# Patient Record
Sex: Female | Born: 1960
Health system: Southern US, Community
[De-identification: ages and names within clinical notes are randomized; demographics above are authoritative.]

## PROBLEM LIST (undated history)

## (undated) DIAGNOSIS — E669 Obesity, unspecified: Secondary | ICD-10-CM

## (undated) DIAGNOSIS — Z973 Presence of spectacles and contact lenses: Secondary | ICD-10-CM

## (undated) DIAGNOSIS — E079 Disorder of thyroid, unspecified: Secondary | ICD-10-CM

## (undated) DIAGNOSIS — J189 Pneumonia, unspecified organism: Secondary | ICD-10-CM

## (undated) DIAGNOSIS — I10 Essential (primary) hypertension: Secondary | ICD-10-CM

## (undated) DIAGNOSIS — H81399 Other peripheral vertigo, unspecified ear: Secondary | ICD-10-CM

## (undated) DIAGNOSIS — R002 Palpitations: Secondary | ICD-10-CM

## (undated) DIAGNOSIS — Z972 Presence of dental prosthetic device (complete) (partial): Secondary | ICD-10-CM

## (undated) DIAGNOSIS — K219 Gastro-esophageal reflux disease without esophagitis: Secondary | ICD-10-CM

## (undated) DIAGNOSIS — E785 Hyperlipidemia, unspecified: Secondary | ICD-10-CM

## (undated) DIAGNOSIS — J45909 Unspecified asthma, uncomplicated: Secondary | ICD-10-CM

## (undated) DIAGNOSIS — F32A Depression, unspecified: Secondary | ICD-10-CM

## (undated) DIAGNOSIS — J449 Chronic obstructive pulmonary disease, unspecified: Secondary | ICD-10-CM

## (undated) DIAGNOSIS — R7301 Impaired fasting glucose: Secondary | ICD-10-CM

## (undated) HISTORY — DX: Essential (primary) hypertension: I10

## (undated) HISTORY — DX: Unspecified asthma, uncomplicated: J45.909

## (undated) HISTORY — DX: Obesity, unspecified: E66.9

## (undated) HISTORY — DX: Other peripheral vertigo, unspecified ear: H81.399

## (undated) HISTORY — DX: Disorder of thyroid, unspecified: E07.9

## (undated) HISTORY — DX: Impaired fasting glucose: R73.01

## (undated) HISTORY — DX: Hyperlipidemia, unspecified: E78.5

## (undated) HISTORY — PX: ABDOMINAL HYSTERECTOMY: SHX81

## (undated) HISTORY — PX: APPENDECTOMY: SHX54

## (undated) HISTORY — DX: Gastro-esophageal reflux disease without esophagitis: K21.9

## (undated) HISTORY — DX: Chronic obstructive pulmonary disease, unspecified: J44.9

---

## 1990-12-22 DIAGNOSIS — C539 Malignant neoplasm of cervix uteri, unspecified: Secondary | ICD-10-CM

## 1990-12-22 HISTORY — PX: ABDOMINAL HYSTERECTOMY: SHX81

## 1990-12-22 HISTORY — DX: Malignant neoplasm of cervix uteri, unspecified: C53.9

## 1990-12-22 HISTORY — PX: APPENDECTOMY: SHX54

## 2007-10-09 ENCOUNTER — Emergency Department (HOSPITAL_COMMUNITY): Admission: EM | Admit: 2007-10-09 | Discharge: 2007-10-10 | Payer: Self-pay | Admitting: Emergency Medicine

## 2008-01-17 ENCOUNTER — Ambulatory Visit: Payer: Self-pay | Admitting: Gastroenterology

## 2008-01-17 LAB — HM COLONOSCOPY

## 2008-01-18 ENCOUNTER — Ambulatory Visit: Payer: Self-pay | Admitting: Gastroenterology

## 2009-03-26 IMAGING — CR DG CHEST 2V
2 series · 2 of 2 positions shown · non-contrast
Comparison: None.

CLINICAL DATA: Shortness of breath, cough and wheezing.
 CHEST ? 2 VIEW:

[view not recorded (1 of 2)]
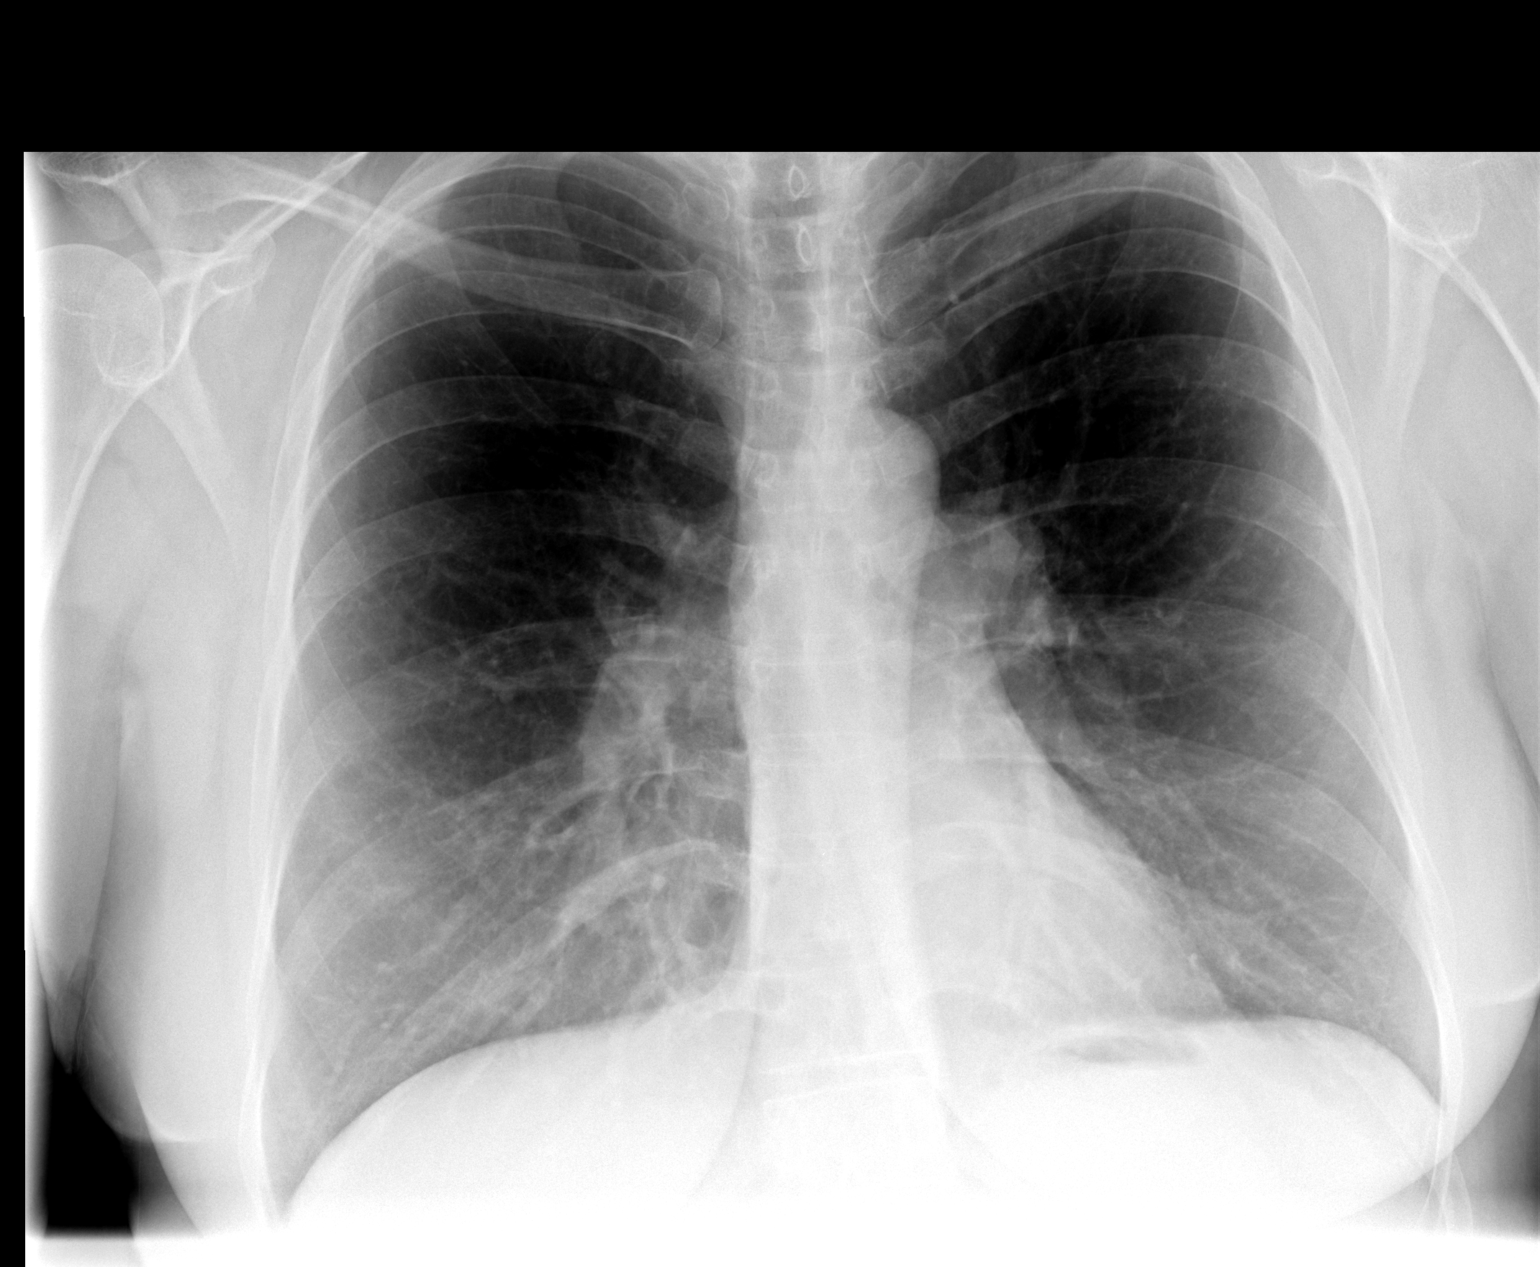

[view not recorded (2 of 2)]
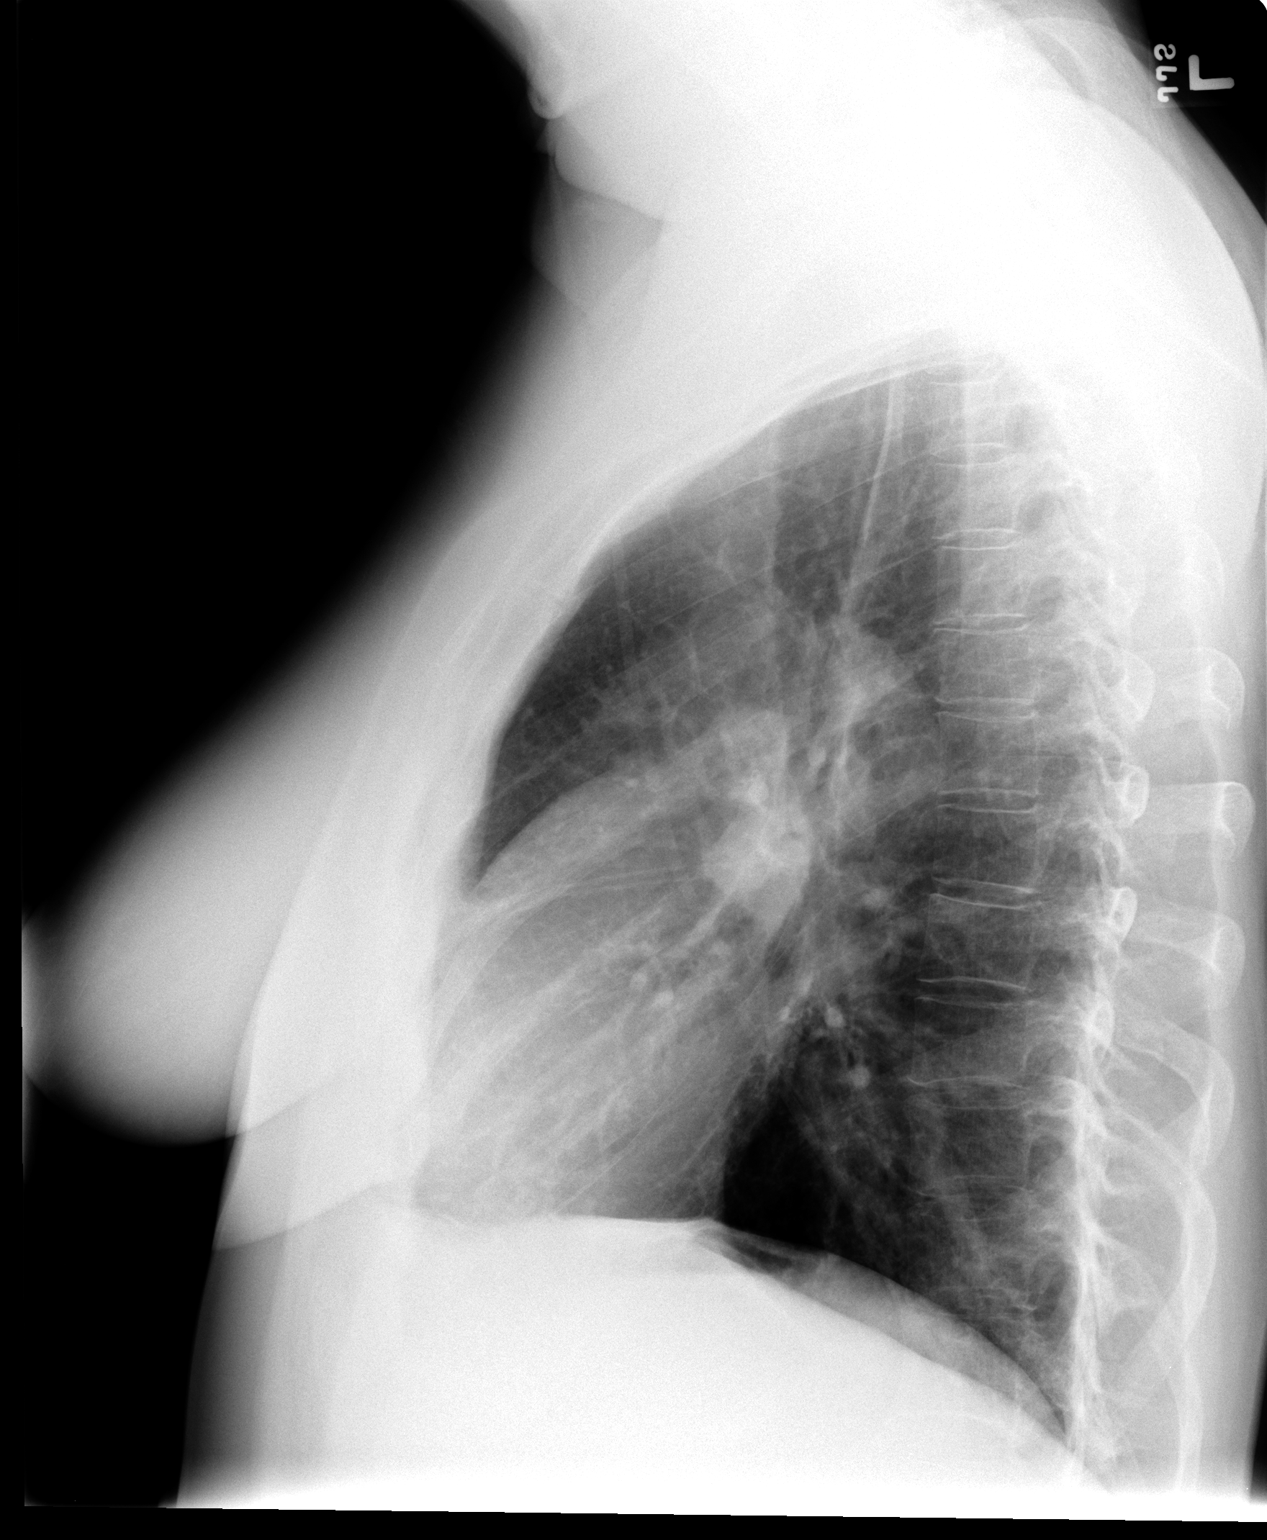

[2 of 2 positions shown; findings below may reference images not displayed]

FINDINGS: There is bilateral hyperexpansion.  No focal consolidation in the left lung.  There is right infrahilar atelectasis or infiltrate.  Cardio pericardial silhouette is within normal limits for size.  Bony structures or the imaged thorax are intact.
IMPRESSION: Atelectasis or infiltrate in the medial right base.  Followup is recommended to ensure resolution.

## 2009-07-05 IMAGING — CR DG BARIUM ENEMA
1 series · 8 of 10 positions shown · non-contrast
Comparison: none

REASON FOR EXAM: family HX of diverticulosis  and colon CA  s/p
colonoscopy
COMMENTS:

PROCEDURE:     FL  - FL BARIUM ENEMA (COLON)  - January 18, 2008  [DATE]
RESULT:     Sigmoid colonic diverticulosis is noted. The colon is otherwise
unremarkable. The RIGHT lower quadrant and terminal ileum are normal.

[Series 1: view not recorded · 0.17mm/px · 8 of 12 slices shown]
[im 1/12]
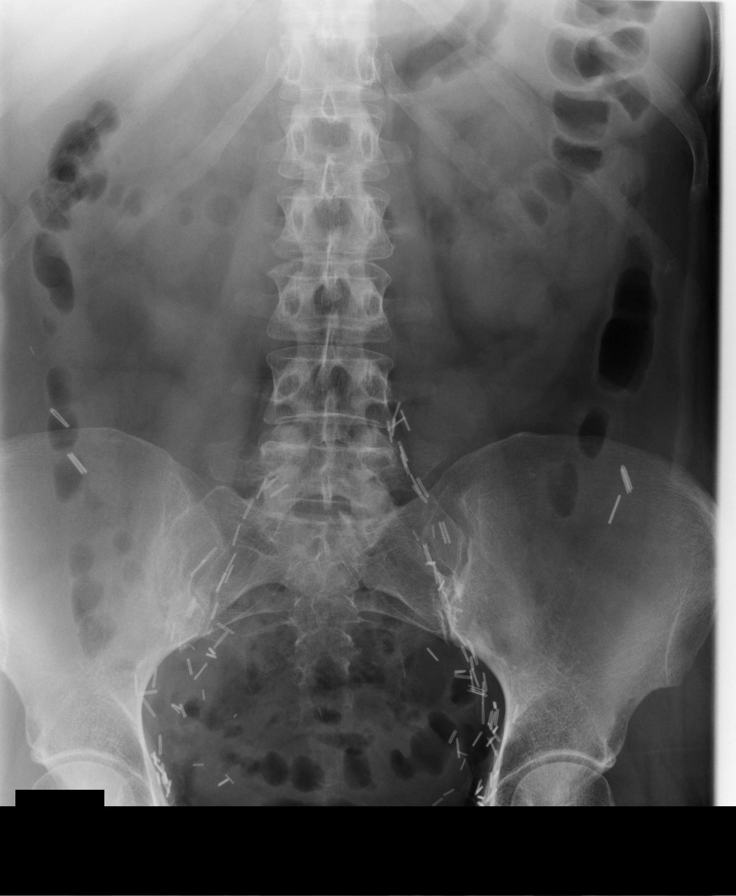
[im 2/12]
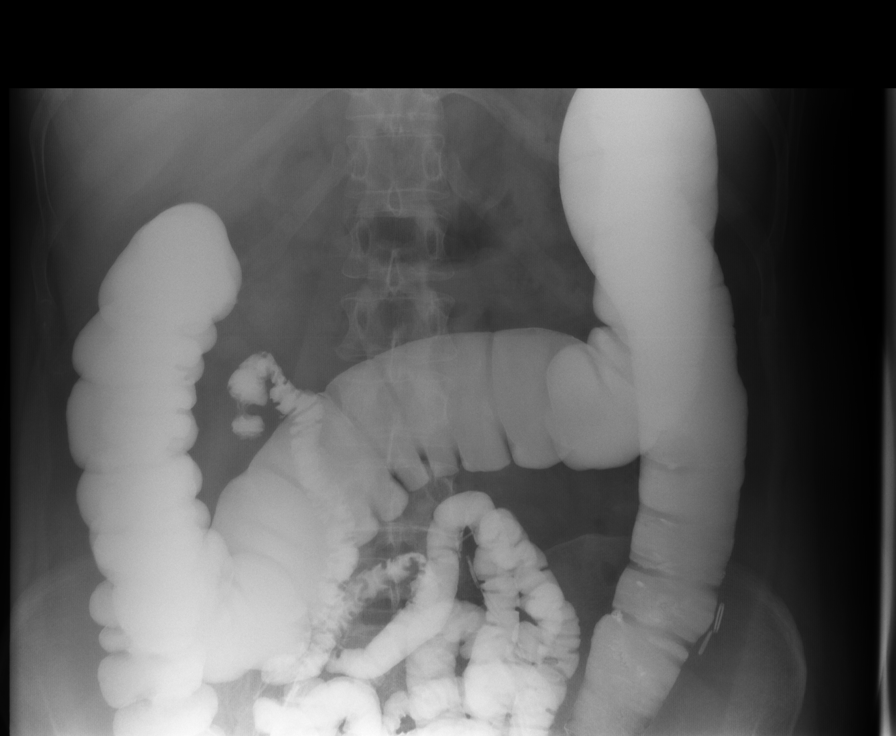
[im 3/12]
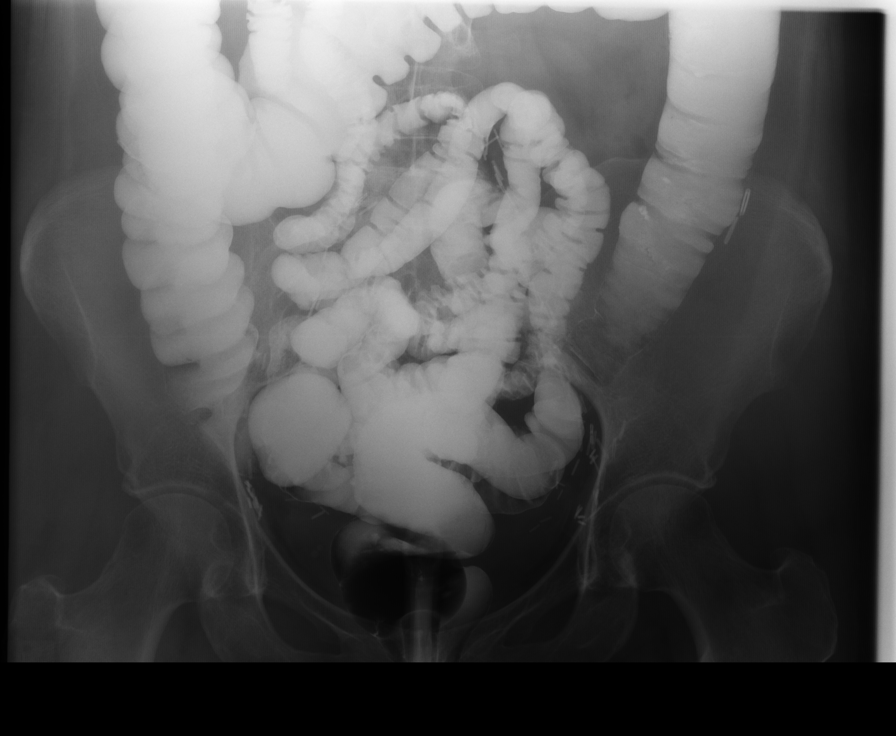
[im 4/12]
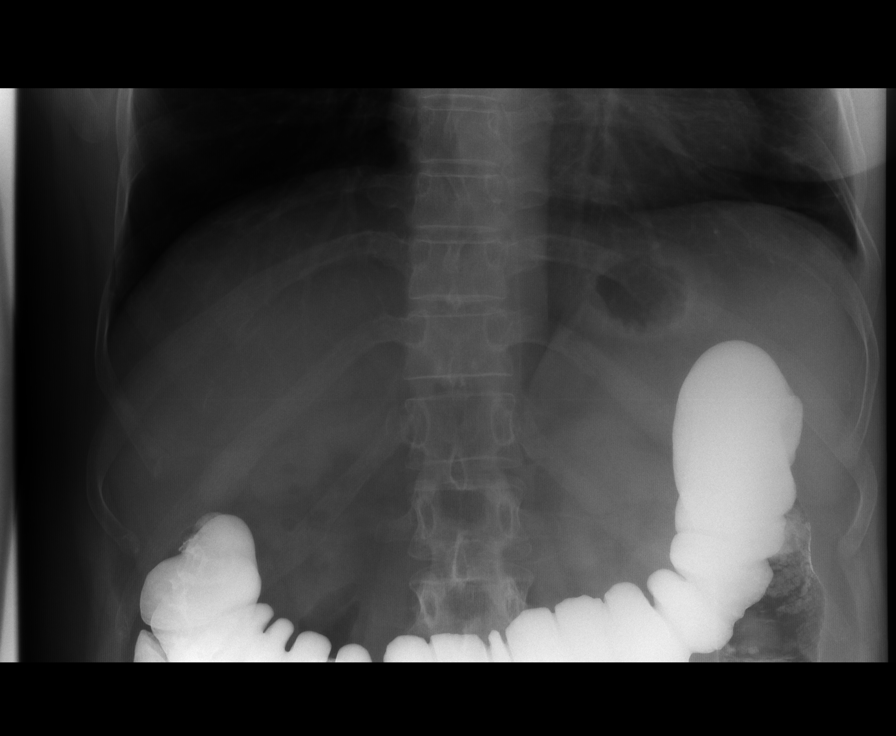
[im 5/12]
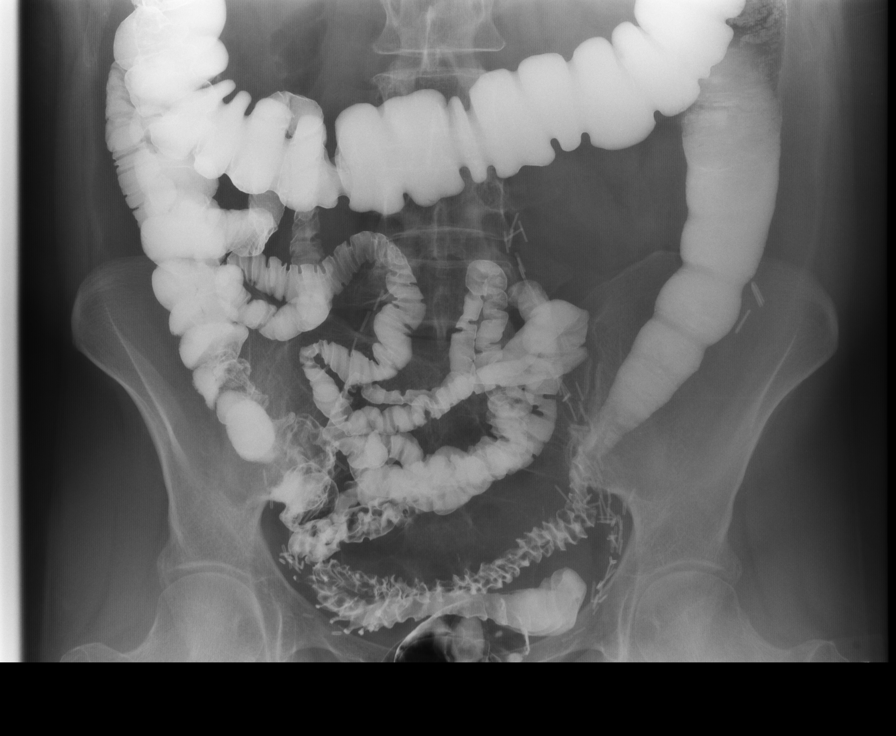
[im 7/12]
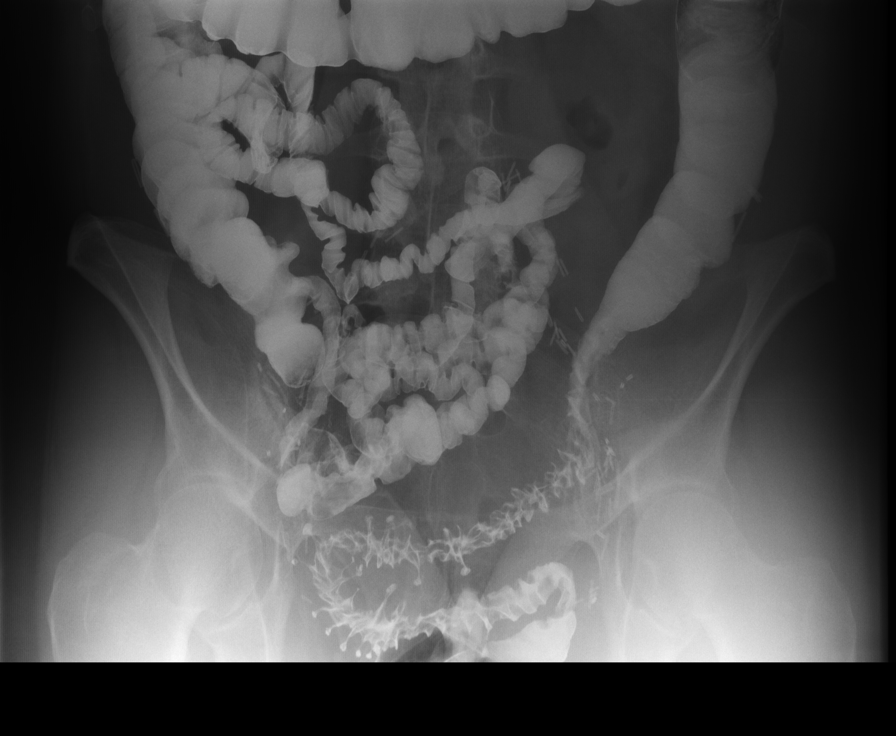
[im 8/12]
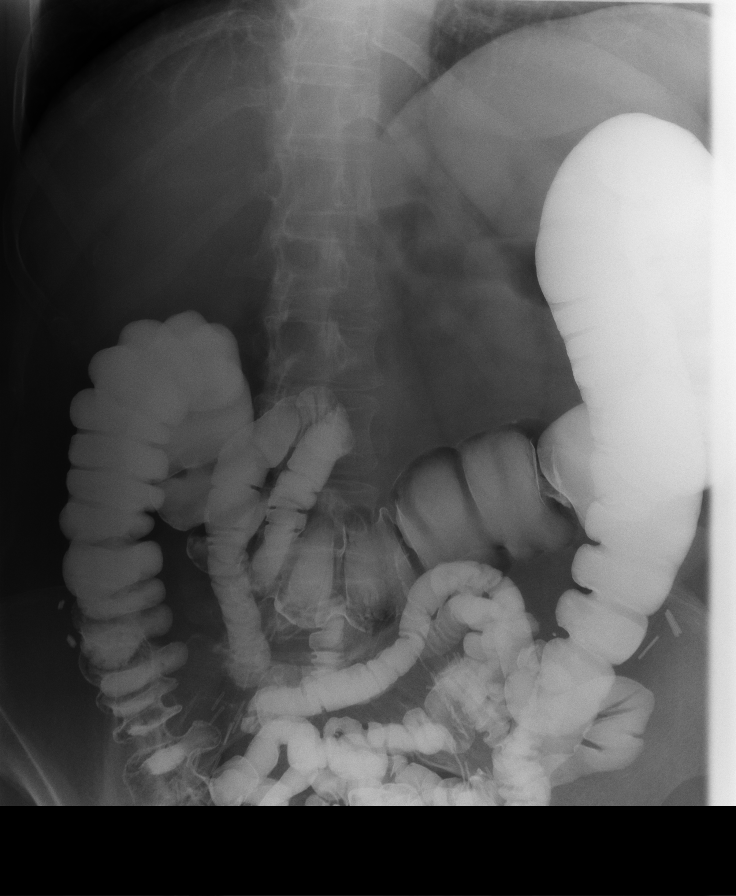
[im 9/12]
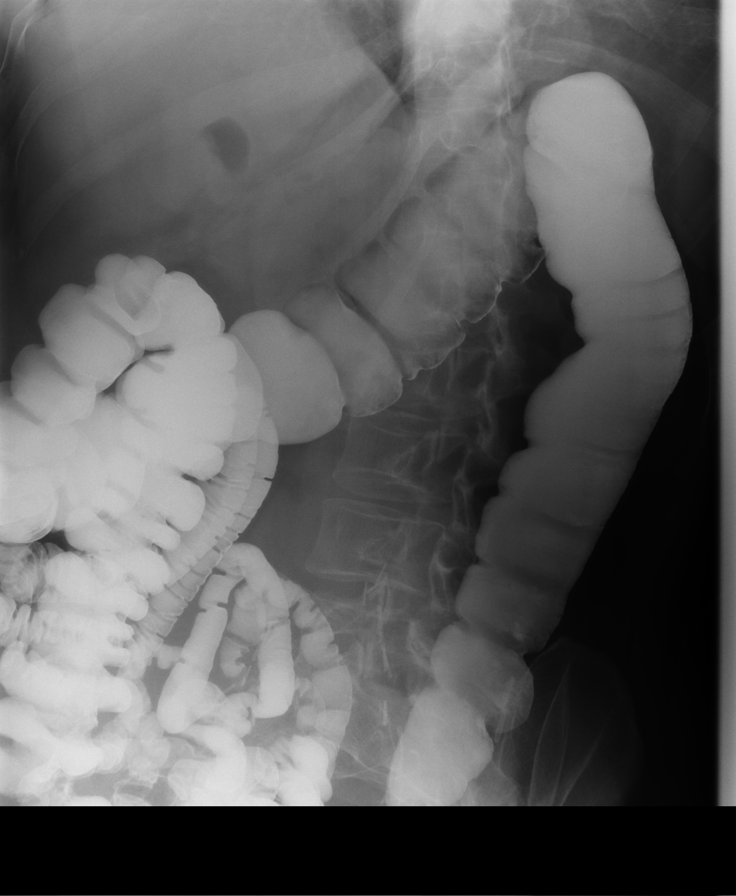

[8 of 10 positions shown; findings below may reference images not displayed]

IMPRESSION: 1)Sigmoid colonic diverticulosis, otherwise, normal exam.

## 2009-07-05 IMAGING — RF DG BARIUM ENEMA
1 series · 10 of 10 positions shown · non-contrast
Comparison: none

REASON FOR EXAM: family HX of diverticulosis  and colon CA  s/p
colonoscopy
COMMENTS:

PROCEDURE:     FL  - FL BARIUM ENEMA (COLON)  - January 18, 2008  [DATE]
RESULT:     Sigmoid colonic diverticulosis is noted. The colon is otherwise
unremarkable. The RIGHT lower quadrant and terminal ileum are normal.

[Series 1: run · 10 of 10 slices shown]
[im 1/10]
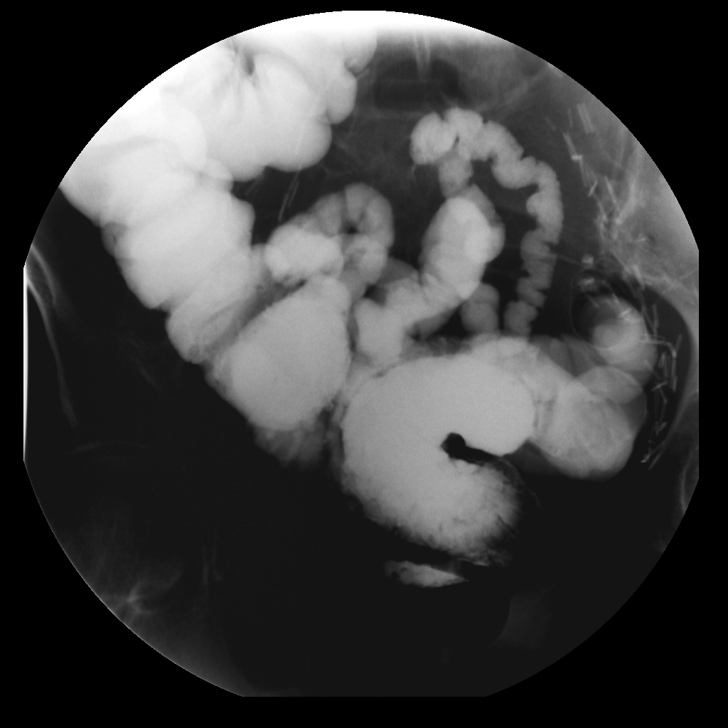
[im 2/10]
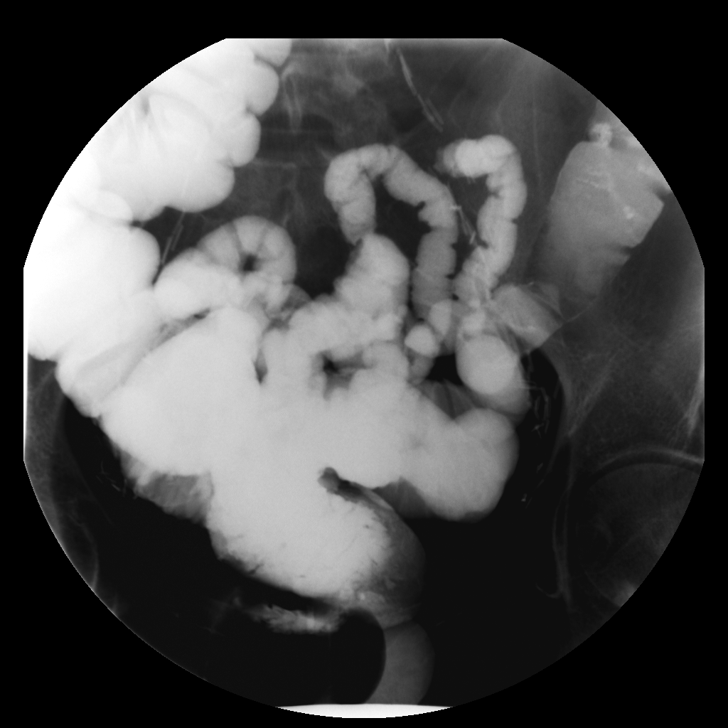
[im 3/10]
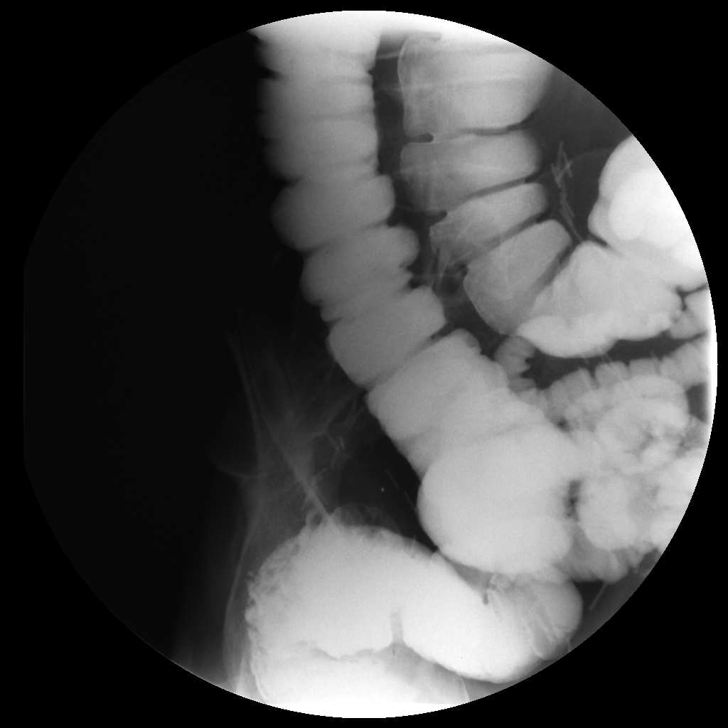
[im 4/10]
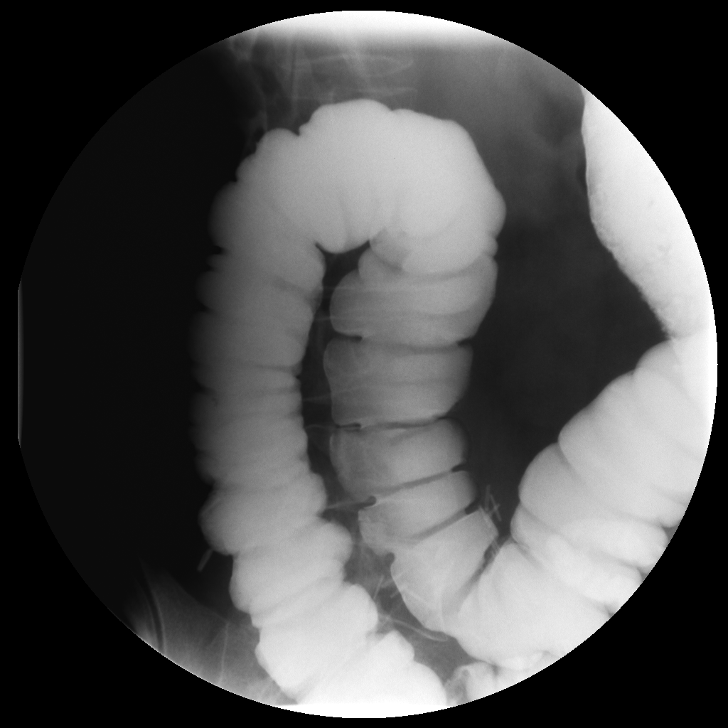
[im 5/10]
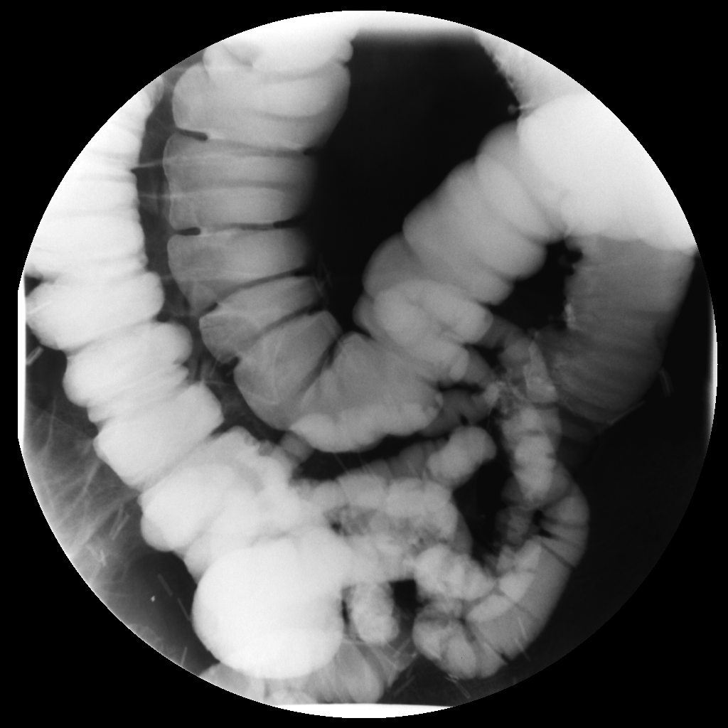
[im 6/10]
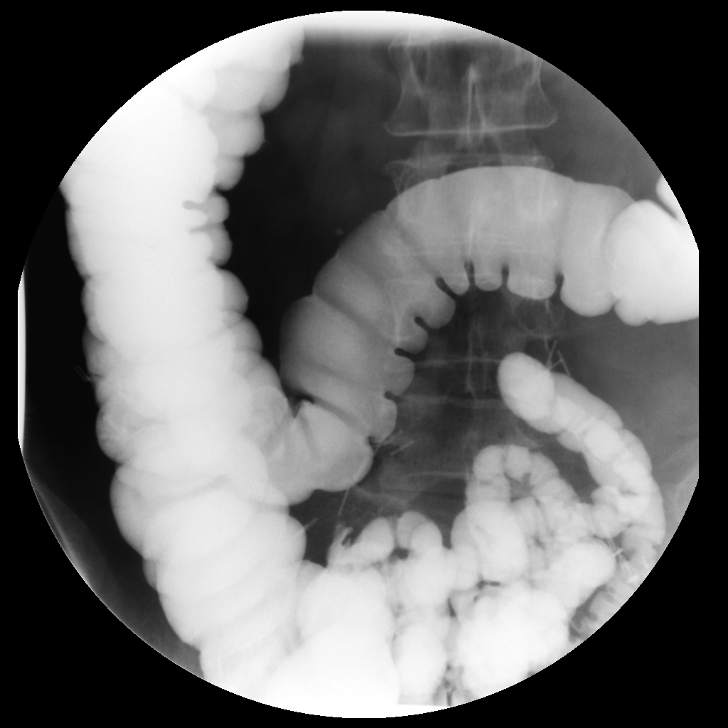
[im 7/10]
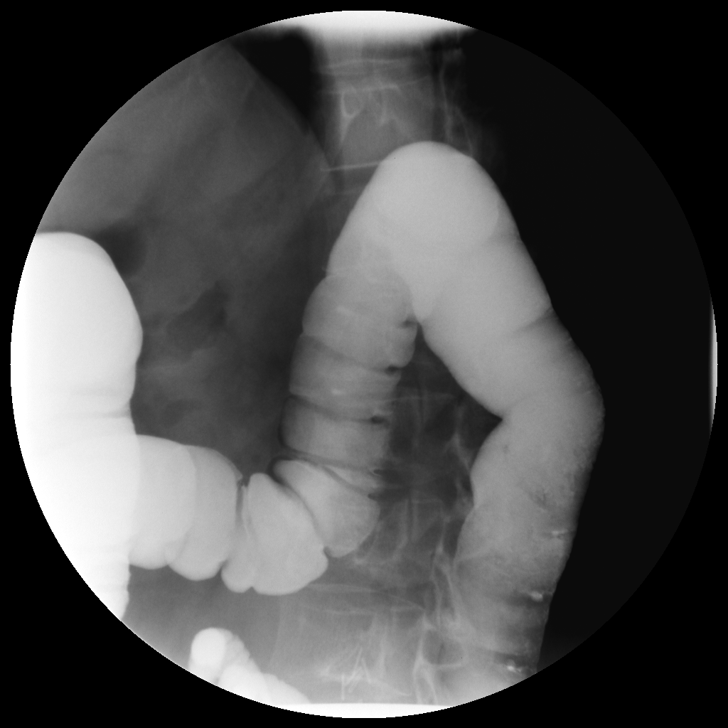
[im 8/10]
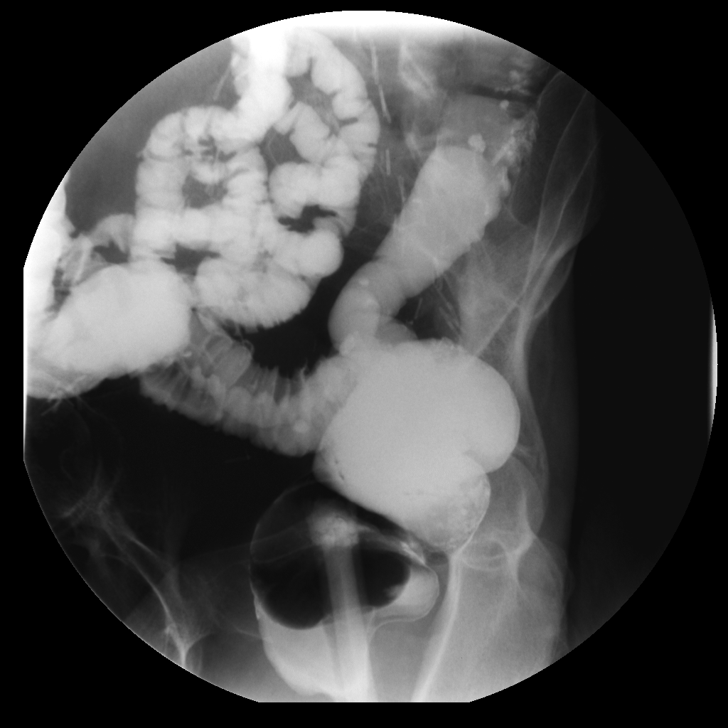
[im 9/10]
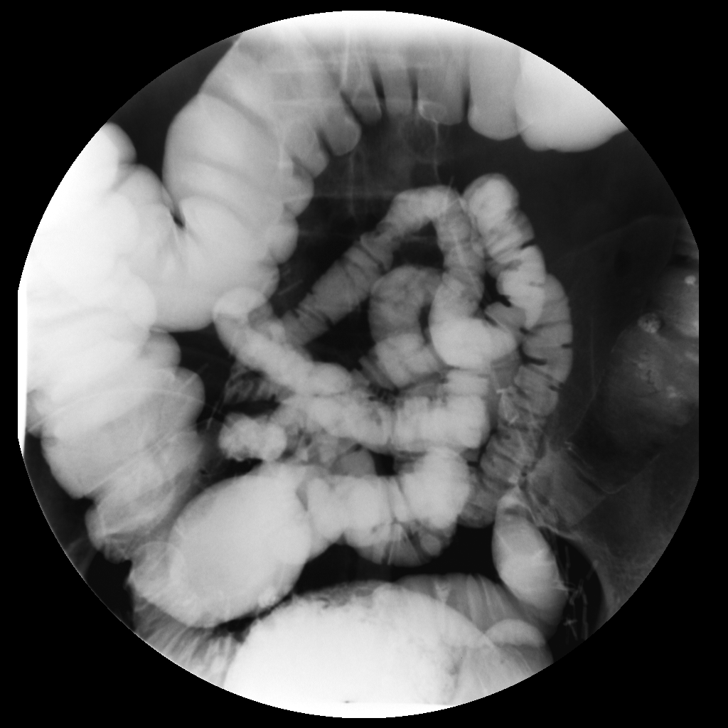
[im 10/10]
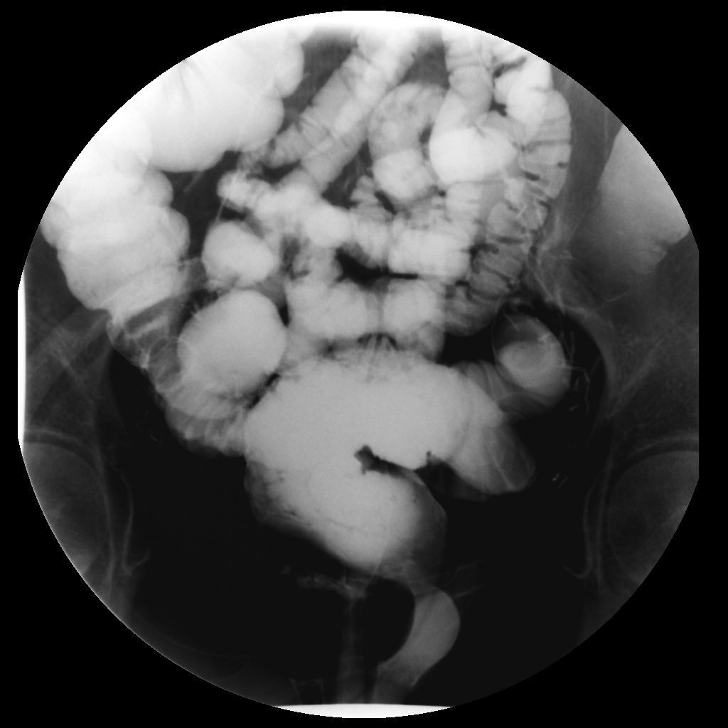

[10 of 10 positions shown; findings below may reference images not displayed]

IMPRESSION: 1)Sigmoid colonic diverticulosis, otherwise, normal exam.

## 2013-01-31 LAB — HM MAMMOGRAPHY

## 2013-02-23 LAB — HM PAP SMEAR

## 2015-06-04 ENCOUNTER — Other Ambulatory Visit: Payer: Self-pay | Admitting: Family Medicine

## 2015-06-07 ENCOUNTER — Telehealth: Payer: Self-pay | Admitting: Unknown Physician Specialty

## 2015-06-07 NOTE — Telephone Encounter (Signed)
I need to know the amount.

## 2015-06-07 NOTE — Telephone Encounter (Signed)
E-Fax came through for refill: Rx: Synthroid tab Rx will be in basket next to my desk.

## 2015-06-08 MED ORDER — LEVOTHYROXINE SODIUM 50 MCG PO TABS: 50.0000 ug | ORAL_TABLET | Freq: Every day | ORAL | Status: DC

## 2015-06-08 NOTE — Addendum Note (Signed)
Addended by: Kathrine Haddock on: 06/08/2015 02:54 PM   Modules accepted: Orders

## 2016-02-06 ENCOUNTER — Encounter: Payer: Self-pay | Admitting: Unknown Physician Specialty

## 2016-02-06 ENCOUNTER — Ambulatory Visit (INDEPENDENT_AMBULATORY_CARE_PROVIDER_SITE_OTHER): Payer: BLUE CROSS/BLUE SHIELD | Admitting: Unknown Physician Specialty

## 2016-02-06 VITALS — BP 155/92 | HR 80 | Temp 98.4°F | Ht 67.0 in | Wt 210.0 lb

## 2016-02-06 DIAGNOSIS — J42 Unspecified chronic bronchitis: Secondary | ICD-10-CM

## 2016-02-06 DIAGNOSIS — E669 Obesity, unspecified: Secondary | ICD-10-CM

## 2016-02-06 DIAGNOSIS — I10 Essential (primary) hypertension: Secondary | ICD-10-CM

## 2016-02-06 DIAGNOSIS — R7301 Impaired fasting glucose: Secondary | ICD-10-CM

## 2016-02-06 DIAGNOSIS — K219 Gastro-esophageal reflux disease without esophagitis: Secondary | ICD-10-CM

## 2016-02-06 DIAGNOSIS — E785 Hyperlipidemia, unspecified: Secondary | ICD-10-CM

## 2016-02-06 DIAGNOSIS — J209 Acute bronchitis, unspecified: Secondary | ICD-10-CM

## 2016-02-06 DIAGNOSIS — J449 Chronic obstructive pulmonary disease, unspecified: Secondary | ICD-10-CM

## 2016-02-06 DIAGNOSIS — H81399 Other peripheral vertigo, unspecified ear: Secondary | ICD-10-CM | POA: Insufficient documentation

## 2016-02-06 DIAGNOSIS — E039 Hypothyroidism, unspecified: Secondary | ICD-10-CM

## 2016-02-06 DIAGNOSIS — J45909 Unspecified asthma, uncomplicated: Secondary | ICD-10-CM

## 2016-02-06 MED ORDER — AZITHROMYCIN 250 MG PO TABS: ORAL_TABLET | ORAL | Status: DC

## 2016-02-06 MED ORDER — PREDNISONE 20 MG PO TABS: 40.0000 mg | ORAL_TABLET | Freq: Every day | ORAL | Status: DC

## 2016-02-06 NOTE — Progress Notes (Signed)
BP 155/92 mmHg  Pulse 80  Temp(Src) 98.4 F (36.9 C)  Ht 5\' 7"  (1.702 m)  Wt 210 lb (95.255 kg)  BMI 32.88 kg/m2  SpO2 95%  LMP 02/05/1991 (Approximate)   Subjective:    Patient ID: [name], female    DOB: [birth year], 55 y.o.   MRN: [id number]  HPI: [name] is a 55 y.o. female  Chief Complaint  Patient presents with  . URI    pt states she had nasal congestion, sinus pressure, and headache that started Saturday and then moved to her chest.    URI  Chronicity: Pt with history of asthma and COOD has a cold  for the last 3 days.  Takes her Symbicort daily and taking her rescue ihaler  The problem has been gradually worsening. Associated symptoms include chest pain, congestion, coughing and wheezing. Associated symptoms comments: C/o SOB . She has tried inhaler use (rare rescue inhaler use) for the symptoms.     Relevant past medical, surgical, family and social history reviewed and updated as indicated. Interim medical history since our last visit reviewed. Allergies and medications reviewed and updated.  Review of Systems  HENT: Positive for congestion.   Respiratory: Positive for cough and wheezing.   Cardiovascular: Positive for chest pain.    Per HPI unless specifically indicated above     Objective:    BP 155/92 mmHg  Pulse 80  Temp(Src) 98.4 F (36.9 C)  Ht 5\' 7"  (1.702 m)  Wt 210 lb (95.255 kg)  BMI 32.88 kg/m2  SpO2 95%  LMP 02/05/1991 (Approximate)  Wt Readings from Last 3 Encounters:  02/06/16 210 lb (95.255 kg)  09/18/14 226 lb (102.513 kg)    Physical Exam  Constitutional: She is oriented to person, place, and time. She appears well-developed and well-nourished. No distress.  HENT:  Head: Normocephalic and atraumatic.  Right Ear: Tympanic membrane and ear canal normal.  Left Ear: Tympanic membrane and ear canal normal.  Nose: Rhinorrhea present. Right sinus exhibits no maxillary sinus tenderness and no frontal sinus tenderness. Left  sinus exhibits no maxillary sinus tenderness and no frontal sinus tenderness.  Mouth/Throat: Mucous membranes are normal. Posterior oropharyngeal erythema present.  Eyes: Conjunctivae and lids are normal. Right eye exhibits no discharge. Left eye exhibits no discharge. No scleral icterus.  Cardiovascular: Normal rate and regular rhythm.   Pulmonary/Chest: Effort normal and breath sounds normal. No respiratory distress.  Abdominal: Normal appearance. There is no splenomegaly or hepatomegaly.  Musculoskeletal: Normal range of motion.  Neurological: She is alert and oriented to person, place, and time.  Skin: Skin is intact. No rash noted. No pallor.  Psychiatric: She has a normal mood and affect. Her behavior is normal. Judgment and thought content normal.  Nursing note and vitals reviewed.   Results for orders placed or performed in visit on 02/06/16  HM MAMMOGRAPHY  Result Value Ref Range   HM Mammogram from PP   HM PAP SMEAR  Result Value Ref Range   HM Pap smear from PP   HM COLONOSCOPY  Result Value Ref Range   HM Colonoscopy from PP       Assessment & Plan:   Problem List Items Addressed This Visit    None    Visit Diagnoses    Acute exacerbation of chronic bronchitis (HCC)    -  Primary    Prednisone.  Increase Albuterol use.  Rx for Zithromax    Relevant Medications    azithromycin (  ZITHROMAX) 250 MG tablet    predniSONE (DELTASONE) 20 MG tablet        Follow up plan: Return if symptoms worsen or fail to improve.

## 2016-03-26 ENCOUNTER — Encounter: Payer: BLUE CROSS/BLUE SHIELD | Admitting: Unknown Physician Specialty

## 2016-05-09 ENCOUNTER — Telehealth: Payer: Self-pay | Admitting: Unknown Physician Specialty

## 2016-05-09 ENCOUNTER — Encounter: Payer: Self-pay | Admitting: Unknown Physician Specialty

## 2016-05-09 ENCOUNTER — Ambulatory Visit (INDEPENDENT_AMBULATORY_CARE_PROVIDER_SITE_OTHER): Payer: BLUE CROSS/BLUE SHIELD | Admitting: Unknown Physician Specialty

## 2016-05-09 VITALS — BP 137/90 | HR 82 | Temp 97.6°F | Ht 67.2 in | Wt 212.0 lb

## 2016-05-09 DIAGNOSIS — J45909 Unspecified asthma, uncomplicated: Secondary | ICD-10-CM

## 2016-05-09 DIAGNOSIS — Z0000 Encounter for general adult medical examination without abnormal findings: Secondary | ICD-10-CM | POA: Diagnosis not present

## 2016-05-09 MED ORDER — BUDESONIDE-FORMOTEROL FUMARATE 160-4.5 MCG/ACT IN AERO: 2.0000 | INHALATION_SPRAY | Freq: Two times a day (BID) | RESPIRATORY_TRACT | Status: DC

## 2016-05-09 NOTE — Telephone Encounter (Signed)
Called and let patient know that rx was a 90 day prescription.

## 2016-05-09 NOTE — Progress Notes (Signed)
BP 137/90 mmHg  Pulse 82  Temp(Src) 97.6 F (36.4 C)  Ht 5' 7.2" (1.707 m)  Wt 212 lb (96.163 kg)  BMI 33.00 kg/m2  SpO2 94%  LMP 02/05/1991 (Approximate)   Subjective:    Patient ID: [name], female    DOB: [birth year], 55 y.o.   MRN: [id number]  HPI: [name] is a 55 y.o. female  Chief Complaint  Patient presents with  . Annual Exam    Hep C and HIV orders entered  . Labs Only    pt would like thyroid checked   ASTHMA Triggers: none Symptoms are well controlled: yes Using medications without problems: yes Night time symptoms: none Wheeze/SOB: none ER visits since last visit: none Missed work or school: none   Relevant past medical, surgical, family and social history reviewed and updated as indicated. Interim medical history since our last visit reviewed. Allergies and medications reviewed and updated.  Review of Systems  Constitutional: Negative.   HENT: Negative.   Eyes: Negative.   Respiratory: Negative.   Cardiovascular: Negative.   Gastrointestinal: Negative.   Endocrine: Negative.   Genitourinary: Negative.   Musculoskeletal: Negative.   Skin: Negative.   Allergic/Immunologic: Negative.   Neurological: Negative.   Hematological: Negative.   Psychiatric/Behavioral: Negative.     Per HPI unless specifically indicated above     Objective:    BP 137/90 mmHg  Pulse 82  Temp(Src) 97.6 F (36.4 C)  Ht 5' 7.2" (1.707 m)  Wt 212 lb (96.163 kg)  BMI 33.00 kg/m2  SpO2 94%  LMP 02/05/1991 (Approximate)  Wt Readings from Last 3 Encounters:  05/09/16 212 lb (96.163 kg)  02/06/16 210 lb (95.255 kg)  09/18/14 226 lb (102.513 kg)    Physical Exam  Constitutional: She is oriented to person, place, and time. She appears well-developed and well-nourished.  HENT:  Head: Normocephalic and atraumatic.  Eyes: Pupils are equal, round, and reactive to light. Right eye exhibits no discharge. Left eye exhibits no discharge. No scleral icterus.   Neck: Normal range of motion. Neck supple. Carotid bruit is not present. No thyromegaly present.  Cardiovascular: Normal rate, regular rhythm and normal heart sounds.  Exam reveals no gallop and no friction rub.   No murmur heard. Pulmonary/Chest: Effort normal and breath sounds normal. No respiratory distress. She has no wheezes. She has no rales.  Abdominal: Soft. Bowel sounds are normal. There is no tenderness. There is no rebound.  Genitourinary: Vagina normal. No breast swelling, tenderness, discharge or bleeding.  Musculoskeletal: Normal range of motion.  Lymphadenopathy:    She has no cervical adenopathy.  Neurological: She is alert and oriented to person, place, and time.  Skin: Skin is warm, dry and intact. No rash noted.  Psychiatric: She has a normal mood and affect. Her speech is normal and behavior is normal. Judgment and thought content normal. Cognition and memory are normal.      Assessment & Plan:   Problem List Items Addressed This Visit      Unprioritized   Asthma    asthma      Relevant Medications   budesonide-formoterol (SYMBICORT) 160-4.5 MCG/ACT inhaler    Other Visit Diagnoses    Health care maintenance    -  Primary    Relevant Orders    Hepatitis C antibody    HIV antibody    CBC with Differential/Platelet    Comprehensive metabolic panel    TSH    MM DIGITAL SCREENING BILATERAL  IGP, Aptima HPV, rfx 16/18,45    Lipid Panel w/o Chol/HDL Ratio    Annual physical exam            Follow up plan: Return in about 1 year (around 05/09/2017).

## 2016-05-09 NOTE — Telephone Encounter (Signed)
Pharmacy called to verify the quantity of the pts budesonide-formoterol (SYMBICORT) 160-4.5 MCG/ACT inhaler, whether it is for 30 days or 90 days.

## 2016-05-09 NOTE — Assessment & Plan Note (Signed)
asthma

## 2016-05-09 NOTE — Telephone Encounter (Signed)
In should be a 90 day rx.  3 inhalers, correct?  Unless that doesn't work out for her price wise

## 2016-05-09 NOTE — Telephone Encounter (Signed)
Routing to provider  

## 2016-05-10 LAB — CBC WITH DIFFERENTIAL/PLATELET
Basophils Absolute: 0 x10E3/uL (ref 0.0–0.2)
Basos: 1 %
EOS (ABSOLUTE): 0.1 x10E3/uL (ref 0.0–0.4)
Eos: 3 %
Hematocrit: 41.9 % (ref 34.0–46.6)
Hemoglobin: 13.5 g/dL (ref 11.1–15.9)
Immature Grans (Abs): 0 x10E3/uL (ref 0.0–0.1)
Immature Granulocytes: 0 %
Lymphocytes Absolute: 1.2 x10E3/uL (ref 0.7–3.1)
Lymphs: 26 %
MCH: 30.3 pg (ref 26.6–33.0)
MCHC: 32.2 g/dL (ref 31.5–35.7)
MCV: 94 fL (ref 79–97)
Monocytes Absolute: 0.5 x10E3/uL (ref 0.1–0.9)
Monocytes: 10 %
Neutrophils Absolute: 2.8 x10E3/uL (ref 1.4–7.0)
Neutrophils: 60 %
Platelets: 163 x10E3/uL (ref 150–379)
RBC: 4.45 x10E6/uL (ref 3.77–5.28)
RDW: 14.3 % (ref 12.3–15.4)
WBC: 4.7 x10E3/uL (ref 3.4–10.8)

## 2016-05-10 LAB — COMPREHENSIVE METABOLIC PANEL
A/G RATIO: 1.8 (ref 1.2–2.2)
ALT: 30 IU/L (ref 0–32)
AST: 22 IU/L (ref 0–40)
Albumin: 4.4 g/dL (ref 3.5–5.5)
Alkaline Phosphatase: 94 IU/L (ref 39–117)
BILIRUBIN TOTAL: 0.4 mg/dL (ref 0.0–1.2)
BUN / CREAT RATIO: 19 (ref 9–23)
BUN: 19 mg/dL (ref 6–24)
CHLORIDE: 106 mmol/L (ref 96–106)
CO2: 26 mmol/L (ref 18–29)
Calcium: 9.2 mg/dL (ref 8.7–10.2)
Creatinine, Ser: 0.98 mg/dL (ref 0.57–1.00)
GFR calc non Af Amer: 64 mL/min/{1.73_m2} (ref >59)
GFR, EST AFRICAN AMERICAN: 74 mL/min/{1.73_m2} (ref >59)
Globulin, Total: 2.4 g/dL (ref 1.5–4.5)
Glucose: 105 mg/dL — ABNORMAL HIGH (ref 65–99)
POTASSIUM: 4.2 mmol/L (ref 3.5–5.2)
SODIUM: 147 mmol/L — AB (ref 134–144)
TOTAL PROTEIN: 6.8 g/dL (ref 6.0–8.5)

## 2016-05-10 LAB — LIPID PANEL W/O CHOL/HDL RATIO
Cholesterol, Total: 170 mg/dL (ref 100–199)
HDL: 48 mg/dL (ref >39)
LDL Calculated: 84 mg/dL (ref 0–99)
Triglycerides: 191 mg/dL — ABNORMAL HIGH (ref 0–149)
VLDL Cholesterol Cal: 38 mg/dL (ref 5–40)

## 2016-05-10 LAB — HIV ANTIBODY (ROUTINE TESTING W REFLEX): HIV SCREEN 4TH GENERATION: NONREACTIVE

## 2016-05-10 LAB — TSH: TSH: 3.62 u[IU]/mL (ref 0.450–4.500)

## 2016-05-10 LAB — HEPATITIS C ANTIBODY: Hep C Virus Ab: <0.1 {s_co_ratio} (ref 0.0–0.9)

## 2016-05-12 NOTE — Progress Notes (Signed)
Quick Note:  Normal labs. Pt notified through mychart ______ 

## 2016-05-14 LAB — IGP, APTIMA HPV, RFX 16/18,45
HPV APTIMA: NEGATIVE
PAP SMEAR COMMENT: 0

## 2017-05-11 ENCOUNTER — Encounter: Payer: BLUE CROSS/BLUE SHIELD | Admitting: Unknown Physician Specialty

## 2017-05-20 ENCOUNTER — Encounter: Payer: Self-pay | Admitting: Unknown Physician Specialty

## 2017-05-28 ENCOUNTER — Other Ambulatory Visit: Payer: Self-pay | Admitting: Unknown Physician Specialty

## 2017-05-28 NOTE — Telephone Encounter (Signed)
Patient has upcoming appointment on 06/12/17.

## 2017-05-28 NOTE — Telephone Encounter (Signed)
Needs appt further refills 

## 2017-06-12 ENCOUNTER — Encounter: Payer: Self-pay | Admitting: Unknown Physician Specialty

## 2017-06-12 ENCOUNTER — Ambulatory Visit (INDEPENDENT_AMBULATORY_CARE_PROVIDER_SITE_OTHER): Payer: BLUE CROSS/BLUE SHIELD | Admitting: Unknown Physician Specialty

## 2017-06-12 VITALS — BP 140/94 | HR 68 | Ht 67.72 in | Wt 220.0 lb

## 2017-06-12 DIAGNOSIS — Z0000 Encounter for general adult medical examination without abnormal findings: Secondary | ICD-10-CM | POA: Diagnosis not present

## 2017-06-12 DIAGNOSIS — J45909 Unspecified asthma, uncomplicated: Secondary | ICD-10-CM | POA: Diagnosis not present

## 2017-06-12 DIAGNOSIS — I10 Essential (primary) hypertension: Secondary | ICD-10-CM | POA: Diagnosis not present

## 2017-06-12 DIAGNOSIS — Z8541 Personal history of malignant neoplasm of cervix uteri: Secondary | ICD-10-CM | POA: Diagnosis not present

## 2017-06-12 MED ORDER — BUDESONIDE-FORMOTEROL FUMARATE 160-4.5 MCG/ACT IN AERO: 2.0000 | INHALATION_SPRAY | Freq: Two times a day (BID) | RESPIRATORY_TRACT | 12 refills | Status: DC

## 2017-06-12 MED ORDER — LEVALBUTEROL TARTRATE 45 MCG/ACT IN AERO: 2.0000 | INHALATION_SPRAY | RESPIRATORY_TRACT | 12 refills | Status: DC | PRN

## 2017-06-12 NOTE — Assessment & Plan Note (Signed)
Good BP at home.  Stressful day

## 2017-06-12 NOTE — Progress Notes (Signed)
BP (!) 140/94 (BP Location: Left Arm, Patient Position: Sitting, Cuff Size: Large)   Pulse 68   Ht 5' 7.72" (1.72 m)   Wt 220 lb (99.8 kg)   LMP 02/05/1991 (Approximate)   SpO2 98%   BMI 33.73 kg/m    Subjective:    Patient ID: [name], female    DOB: 04/16/1961, 56 y.o.   MRN: [id number]  HPI: [name] is a 56 y.o. female  Chief Complaint  Patient presents with  . Annual Exam   ASTHMA Triggers: none Symptoms are well controlled: yes Using medications without problems:yes Night time symptoms:none Wheeze/SOB:rare ER visits since last visit:none Taking Albuterol on occasion  BP is 110/80's at home.  Stressful day  Social History   Social History  . Marital status: Divorced    Spouse name: N/A  . Number of children: N/A  . Years of education: N/A   Occupational History  . Not on file.   Social History Main Topics  . Smoking status: Former Research scientist (life sciences)  . Smokeless tobacco: Never Used  . Alcohol use No  . Drug use: No  . Sexual activity: Yes   Other Topics Concern  . Not on file   Social History Narrative  . No narrative on file    Family History  Problem Relation Age of Onset  . Cancer Mother        colon  . Stroke Mother   . Cancer Father        brain  . Heart disease Father   . Heart disease Maternal Grandmother   . Cancer Maternal Grandfather        colon   Past Medical History:  Diagnosis Date  . Asthma   . COPD (chronic obstructive pulmonary disease) ([place])   . GERD (gastroesophageal reflux disease)   . Hyperlipidemia   . Hypertension   . IFG (impaired fasting glucose)   . Obesity   . Peripheral vertigo   . Thyroid disease    Past Surgical History:  Procedure Laterality Date  . ABDOMINAL HYSTERECTOMY      Relevant past medical, surgical, family and social history reviewed and updated as indicated. Interim medical history since our last visit reviewed. Allergies and medications reviewed and updated.  Review of  Systems  Per HPI unless specifically indicated above     Objective:    BP (!) 140/94 (BP Location: Left Arm, Patient Position: Sitting, Cuff Size: Large)   Pulse 68   Ht 5' 7.72" (1.72 m)   Wt 220 lb (99.8 kg)   LMP 02/05/1991 (Approximate)   SpO2 98%   BMI 33.73 kg/m   Wt Readings from Last 3 Encounters:  06/12/17 220 lb (99.8 kg)  05/09/16 212 lb (96.2 kg)  02/06/16 210 lb (95.3 kg)    Physical Exam  Constitutional: She is oriented to person, place, and time. She appears well-developed and well-nourished.  HENT:  Head: Normocephalic and atraumatic.  Eyes: Pupils are equal, round, and reactive to light. Right eye exhibits no discharge. Left eye exhibits no discharge. No scleral icterus.  Neck: Normal range of motion. Neck supple. Carotid bruit is not present. No thyromegaly present.  Cardiovascular: Normal rate, regular rhythm and normal heart sounds.  Exam reveals no gallop and no friction rub.   No murmur heard. Pulmonary/Chest: Effort normal and breath sounds normal. No respiratory distress. She has no wheezes. She has no rales.  Abdominal: Soft. Bowel sounds are normal. There is no tenderness. There is  no rebound.  Genitourinary: No breast swelling, tenderness or discharge.  Musculoskeletal: Normal range of motion.  Lymphadenopathy:    She has no cervical adenopathy.  Neurological: She is alert and oriented to person, place, and time.  Skin: Skin is warm, dry and intact. No rash noted.  Psychiatric: She has a normal mood and affect. Her speech is normal and behavior is normal. Judgment and thought content normal. Cognition and memory are normal.      Assessment & Plan:   Problem List Items Addressed This Visit      Unprioritized   Asthma    Stable, continue present medications.        Relevant Medications   budesonide-formoterol (SYMBICORT) 160-4.5 MCG/ACT inhaler   levalbuterol (XOPENEX HFA) 45 MCG/ACT inhaler   Benign hypertension    Good BP at home.   Stressful day      History of cervical cancer   Relevant Orders   IGP, Aptima HPV, rfx 16/18,45    Other Visit Diagnoses    Annual physical exam    -  Primary   Relevant Orders   CBC with Differential/Platelet   Comprehensive metabolic panel   Lipid panel   TSH   UA/M w/rflx Culture, Routine       Follow up plan: Return in about 1 year (around 06/12/2018).

## 2017-06-12 NOTE — Addendum Note (Signed)
Addended by: Kathrine Haddock on: 06/12/2017 02:07 PM   Modules accepted: Orders

## 2017-06-12 NOTE — Assessment & Plan Note (Signed)
Stable, continue present medications.   

## 2017-06-13 LAB — CBC WITH DIFFERENTIAL/PLATELET
BASOS: 1 %
Basophils Absolute: 0 10*3/uL (ref 0.0–0.2)
EOS (ABSOLUTE): 0.1 10*3/uL (ref 0.0–0.4)
EOS: 2 %
HEMATOCRIT: 43.2 % (ref 34.0–46.6)
Hemoglobin: 14.1 g/dL (ref 11.1–15.9)
Immature Grans (Abs): 0 10*3/uL (ref 0.0–0.1)
Immature Granulocytes: 1 %
LYMPHS ABS: 1.1 10*3/uL (ref 0.7–3.1)
Lymphs: 28 %
MCH: 30.9 pg (ref 26.6–33.0)
MCHC: 32.6 g/dL (ref 31.5–35.7)
MCV: 95 fL (ref 79–97)
MONOS ABS: 0.3 10*3/uL (ref 0.1–0.9)
Monocytes: 7 %
Neutrophils Absolute: 2.5 10*3/uL (ref 1.4–7.0)
Neutrophils: 61 %
Platelets: 168 10*3/uL (ref 150–379)
RBC: 4.57 x10E6/uL (ref 3.77–5.28)
RDW: 14.1 % (ref 12.3–15.4)
WBC: 4.1 10*3/uL (ref 3.4–10.8)

## 2017-06-13 LAB — LIPID PANEL
CHOL/HDL RATIO: 3.5 ratio (ref 0.0–4.4)
CHOLESTEROL TOTAL: 162 mg/dL (ref 100–199)
HDL: 46 mg/dL (ref >39)
LDL Calculated: 70 mg/dL (ref 0–99)
TRIGLYCERIDES: 231 mg/dL — AB (ref 0–149)
VLDL Cholesterol Cal: 46 mg/dL — ABNORMAL HIGH (ref 5–40)

## 2017-06-13 LAB — COMPREHENSIVE METABOLIC PANEL
A/G RATIO: 1.8 (ref 1.2–2.2)
ALK PHOS: 95 IU/L (ref 39–117)
ALT: 45 IU/L — ABNORMAL HIGH (ref 0–32)
AST: 32 IU/L (ref 0–40)
Albumin: 4.4 g/dL (ref 3.5–5.5)
BILIRUBIN TOTAL: 0.4 mg/dL (ref 0.0–1.2)
BUN/Creatinine Ratio: 14 (ref 9–23)
BUN: 15 mg/dL (ref 6–24)
CO2: 21 mmol/L (ref 20–29)
Calcium: 9.3 mg/dL (ref 8.7–10.2)
Chloride: 107 mmol/L — ABNORMAL HIGH (ref 96–106)
Creatinine, Ser: 1.08 mg/dL — ABNORMAL HIGH (ref 0.57–1.00)
GFR calc Af Amer: 65 mL/min/{1.73_m2} (ref >59)
GFR, EST NON AFRICAN AMERICAN: 57 mL/min/{1.73_m2} — AB (ref >59)
GLOBULIN, TOTAL: 2.4 g/dL (ref 1.5–4.5)
Glucose: 95 mg/dL (ref 65–99)
POTASSIUM: 3.9 mmol/L (ref 3.5–5.2)
SODIUM: 143 mmol/L (ref 134–144)
Total Protein: 6.8 g/dL (ref 6.0–8.5)

## 2017-06-13 LAB — TSH: TSH: 4.66 u[IU]/mL — ABNORMAL HIGH (ref 0.450–4.500)

## 2017-06-18 LAB — IGP, APTIMA HPV, RFX 16/18,45
HPV Aptima: NEGATIVE
PAP SMEAR COMMENT: 0

## 2017-06-19 LAB — URINE CULTURE, REFLEX

## 2017-06-25 ENCOUNTER — Encounter: Payer: Self-pay | Admitting: Unknown Physician Specialty

## 2017-06-25 LAB — UA/M W/RFLX CULTURE, ROUTINE
BILIRUBIN UA: NEGATIVE
GLUCOSE, UA: NEGATIVE
KETONES UA: NEGATIVE
NITRITE UA: NEGATIVE
Protein, UA: NEGATIVE
SPEC GRAV UA: 1.025 (ref 1.005–1.030)
Urobilinogen, Ur: 0.2 mg/dL (ref 0.2–1.0)
pH, UA: 5.5 (ref 5.0–7.5)

## 2017-06-25 LAB — MICROSCOPIC EXAMINATION

## 2017-07-14 ENCOUNTER — Telehealth: Payer: Self-pay | Admitting: Unknown Physician Specialty

## 2017-07-14 DIAGNOSIS — I10 Essential (primary) hypertension: Secondary | ICD-10-CM

## 2017-07-14 DIAGNOSIS — E039 Hypothyroidism, unspecified: Secondary | ICD-10-CM

## 2017-07-14 MED ORDER — LEVOTHYROXINE SODIUM 50 MCG PO TABS: 50.0000 ug | ORAL_TABLET | Freq: Every day | ORAL | 0 refills | Status: DC

## 2017-07-14 NOTE — Telephone Encounter (Signed)
Discussed with pt high TSH.  Was on thyroid meds at one time.  Will start 50 mcgs/day

## 2017-07-14 NOTE — Telephone Encounter (Signed)
Also recheck CMP in 3 months.

## 2017-12-17 ENCOUNTER — Telehealth: Payer: Self-pay | Admitting: Unknown Physician Specialty

## 2017-12-17 DIAGNOSIS — E039 Hypothyroidism, unspecified: Secondary | ICD-10-CM

## 2017-12-17 NOTE — Telephone Encounter (Signed)
Routing to provider. Patient last seen 06/12/17.

## 2017-12-17 NOTE — Telephone Encounter (Signed)
Copied from [place] [id number]. Topic: Quick Communication - See Telephone Encounter >> Dec 17, 2017  8:54 AM Hewitt Shorts wrote: CRM for notification. See Telephone encounter for: pt is needing a refill on her thyroid medication and she has been off of it to long to come in and have labs regarding this she is wanting to get enough to get back in system and then call and schedule for labs   Best number [phone] pharmacy [facility] [place]      12/17/17.

## 2017-12-17 NOTE — Telephone Encounter (Signed)
Last refill 07/14/17 90 tabs no refills. Please see note. Does she need lab first or to start her on med first then lab work?

## 2017-12-18 MED ORDER — LEVOTHYROXINE SODIUM 50 MCG PO TABS: 50.0000 ug | ORAL_TABLET | Freq: Every day | ORAL | 0 refills | Status: DC

## 2017-12-18 NOTE — Telephone Encounter (Signed)
I will call in 3 months worth

## 2017-12-18 NOTE — Telephone Encounter (Signed)
Called and let patient know that her prescription was sent in for her. Patient stated that she would come in for labs.

## 2018-02-23 ENCOUNTER — Encounter: Payer: Self-pay | Admitting: Unknown Physician Specialty

## 2018-03-22 ENCOUNTER — Other Ambulatory Visit: Payer: Self-pay

## 2018-03-22 ENCOUNTER — Telehealth: Payer: Self-pay | Admitting: Unknown Physician Specialty

## 2018-03-22 DIAGNOSIS — R7989 Other specified abnormal findings of blood chemistry: Secondary | ICD-10-CM

## 2018-03-22 NOTE — Telephone Encounter (Signed)
Copied from [place]. Topic: Quick Communication - See Telephone Encounter >> Mar 22, 2018  4:24 PM [name] wrote: CRM for notification. See Telephone encounter for: 03/22/18. Pt called in and stated that she was suppose  to come back in Daniel month to do blood work and recheck her TSH?  I didn't seen any labs in?  Please advise   Best number  [phone]

## 2018-03-22 NOTE — Telephone Encounter (Signed)
Routing to provider. Can we enter a TSH order please?

## 2018-03-22 NOTE — Telephone Encounter (Signed)
Order entered. Patient notified that order is in and that she can stop by for lab draw.

## 2018-03-23 ENCOUNTER — Other Ambulatory Visit: Payer: BLUE CROSS/BLUE SHIELD

## 2018-03-23 DIAGNOSIS — R7989 Other specified abnormal findings of blood chemistry: Secondary | ICD-10-CM

## 2018-03-23 DIAGNOSIS — I10 Essential (primary) hypertension: Secondary | ICD-10-CM

## 2018-03-24 LAB — TSH: TSH: 3.03 u[IU]/mL (ref 0.450–4.500)

## 2018-03-24 LAB — COMPREHENSIVE METABOLIC PANEL
ALT: 34 IU/L — ABNORMAL HIGH (ref 0–32)
AST: 26 IU/L (ref 0–40)
Albumin/Globulin Ratio: 1.9 (ref 1.2–2.2)
Albumin: 4.4 g/dL (ref 3.5–5.5)
Alkaline Phosphatase: 103 IU/L (ref 39–117)
BUN/Creatinine Ratio: 14 (ref 9–23)
BUN: 14 mg/dL (ref 6–24)
Bilirubin Total: 0.5 mg/dL (ref 0.0–1.2)
CALCIUM: 9.3 mg/dL (ref 8.7–10.2)
CO2: 25 mmol/L (ref 20–29)
Chloride: 107 mmol/L — ABNORMAL HIGH (ref 96–106)
Creatinine, Ser: 0.99 mg/dL (ref 0.57–1.00)
GFR, EST AFRICAN AMERICAN: 72 mL/min/{1.73_m2} (ref >59)
GFR, EST NON AFRICAN AMERICAN: 63 mL/min/{1.73_m2} (ref >59)
GLOBULIN, TOTAL: 2.3 g/dL (ref 1.5–4.5)
Glucose: 96 mg/dL (ref 65–99)
Potassium: 4.1 mmol/L (ref 3.5–5.2)
SODIUM: 145 mmol/L — AB (ref 134–144)
TOTAL PROTEIN: 6.7 g/dL (ref 6.0–8.5)

## 2018-03-29 ENCOUNTER — Telehealth: Payer: Self-pay | Admitting: Unknown Physician Specialty

## 2018-03-29 DIAGNOSIS — E039 Hypothyroidism, unspecified: Secondary | ICD-10-CM

## 2018-03-29 MED ORDER — LEVOTHYROXINE SODIUM 50 MCG PO TABS: 50.0000 ug | ORAL_TABLET | Freq: Every day | ORAL | 3 refills | Status: DC

## 2018-03-29 NOTE — Telephone Encounter (Signed)
Patient notified

## 2018-03-29 NOTE — Telephone Encounter (Signed)
Copied from [place] [id number]. Topic: Quick Communication - See Telephone Encounter >> Mar 29, 2018  3:51 PM Boyd Kerbs wrote: CRM for notification.   Pt. Calling about lab results from 4/2, has not heard anything.   Thyroid medicine, - she is out and was seeing if Kathrine Haddock was going to call more in?    See Telephone encounter for: 03/29/18.

## 2018-03-29 NOTE — Telephone Encounter (Signed)
Please apologize for that.  They look good and better than last visit.  Will send in Levothyroxine.

## 2018-03-29 NOTE — Telephone Encounter (Signed)
Routing to provider. I do not see where a letter or message was sent to the patient. Also needs refill on levothyroxine.

## 2018-06-14 ENCOUNTER — Encounter: Payer: BLUE CROSS/BLUE SHIELD | Admitting: Unknown Physician Specialty

## 2018-07-05 ENCOUNTER — Encounter: Payer: BLUE CROSS/BLUE SHIELD | Admitting: Unknown Physician Specialty

## 2018-09-11 ENCOUNTER — Other Ambulatory Visit: Payer: Self-pay | Admitting: Unknown Physician Specialty

## 2018-09-13 MED ORDER — BUDESONIDE-FORMOTEROL FUMARATE 160-4.5 MCG/ACT IN AERO: 2.0000 | INHALATION_SPRAY | Freq: Two times a day (BID) | RESPIRATORY_TRACT | 0 refills | Status: DC

## 2018-09-13 NOTE — Telephone Encounter (Signed)
symbicort refill request LOV 06/12/17 with 12 refills Last ordered on 06/12/17 with  Kathrine Haddock, NP [place] [facility] left message to call to schedule a visit in order to continue refills.

## 2018-09-13 NOTE — Addendum Note (Signed)
Addended by: Tarry Kos on: 09/13/2018 12:23 PM   Modules accepted: Orders

## 2018-11-04 ENCOUNTER — Ambulatory Visit: Payer: BLUE CROSS/BLUE SHIELD | Admitting: Family Medicine

## 2018-11-04 ENCOUNTER — Encounter: Payer: Self-pay | Admitting: Family Medicine

## 2018-11-04 ENCOUNTER — Other Ambulatory Visit: Payer: Self-pay

## 2018-11-04 VITALS — BP 169/105 | HR 78 | Temp 98.0°F | Ht 68.0 in | Wt 231.0 lb

## 2018-11-04 DIAGNOSIS — I10 Essential (primary) hypertension: Secondary | ICD-10-CM

## 2018-11-04 DIAGNOSIS — J449 Chronic obstructive pulmonary disease, unspecified: Secondary | ICD-10-CM

## 2018-11-04 DIAGNOSIS — E039 Hypothyroidism, unspecified: Secondary | ICD-10-CM | POA: Diagnosis not present

## 2018-11-04 MED ORDER — BUDESONIDE-FORMOTEROL FUMARATE 160-4.5 MCG/ACT IN AERO: 2.0000 | INHALATION_SPRAY | Freq: Two times a day (BID) | RESPIRATORY_TRACT | 11 refills | Status: DC

## 2018-11-04 NOTE — Assessment & Plan Note (Signed)
Stable without recent flares, continue current regimen 

## 2018-11-04 NOTE — Assessment & Plan Note (Signed)
Not at goal, but intolerant to any BP medications. Work on stress reduction, diet and exercise. Recheck at upcoming CPE

## 2018-11-04 NOTE — Progress Notes (Signed)
BP (!) 169/105   Pulse 78   Temp 98 F (36.7 C) (Oral)   Ht 5\' 8"  (1.727 m)   Wt 231 lb (104.8 kg)   LMP 02/05/1991 (Approximate)   SpO2 98%   BMI 35.12 kg/m    Subjective:    Patient ID: [name], female    DOB: [birth year], 59 y.o.   MRN: [id number]  HPI: [name] is a 57 y.o. female  Chief Complaint  Patient presents with  . Medication Refill    symbicort  Here today for follow up COPD. Currently just on symbicort BID. Has xopenex at home but rarely uses it. No recent flares but states winter is her worst time. Former smoker.    Has tried BP medicines in the past but they don't do well for her. They cause fainting no matter the dose or type. Made changes to diet and exercise and has been able to keep things under decent control historically. States currently work is very stressful and she thinks that's getting to her. Denies CP, visual changes, HAs, SOB.   Past Medical History:  Diagnosis Date  . Asthma   . COPD (chronic obstructive pulmonary disease) ([place])   . GERD (gastroesophageal reflux disease)   . Hyperlipidemia   . Hypertension   . IFG (impaired fasting glucose)   . Obesity   . Peripheral vertigo   . Thyroid disease    Relevant past medical, surgical, family and social history reviewed and updated as indicated. Interim medical history since our last visit reviewed. Allergies and medications reviewed and updated.  Review of Systems  Per HPI unless specifically indicated above     Objective:    BP (!) 169/105   Pulse 78   Temp 98 F (36.7 C) (Oral)   Ht 5\' 8"  (1.727 m)   Wt 231 lb (104.8 kg)   LMP 02/05/1991 (Approximate)   SpO2 98%   BMI 35.12 kg/m   Wt Readings from Last 3 Encounters:  11/04/18 231 lb (104.8 kg)  06/12/17 220 lb (99.8 kg)  05/09/16 212 lb (96.2 kg)    Physical Exam  Constitutional: She is oriented to person, place, and time. She appears well-developed and well-nourished.  HENT:  Head: Atraumatic.  Eyes:  Conjunctivae and EOM are normal.  Neck: Normal range of motion. Neck supple.  Cardiovascular: Normal rate and regular rhythm.  Pulmonary/Chest: Effort normal. She has no wheezes.  Musculoskeletal: Normal range of motion.  Neurological: She is alert and oriented to person, place, and time.  Skin: Skin is warm and dry.  Psychiatric: She has a normal mood and affect. Her behavior is normal.  Nursing note and vitals reviewed.   Results for orders placed or performed in visit on 03/23/18  TSH  Result Value Ref Range   TSH 3.030 0.450 - 4.500 uIU/mL  Comprehensive metabolic panel  Result Value Ref Range   Glucose 96 65 - 99 mg/dL   BUN 14 6 - 24 mg/dL   Creatinine, Ser 0.99 0.57 - 1.00 mg/dL   GFR calc non Af Amer 63 >59 mL/min/1.73   GFR calc Af Amer 72 >59 mL/min/1.73   BUN/Creatinine Ratio 14 9 - 23   Sodium 145 (H) 134 - 144 mmol/L   Potassium 4.1 3.5 - 5.2 mmol/L   Chloride 107 (H) 96 - 106 mmol/L   CO2 25 20 - 29 mmol/L   Calcium 9.3 8.7 - 10.2 mg/dL   Total Protein 6.7 6.0 - 8.5 g/dL  Albumin 4.4 3.5 - 5.5 g/dL   Globulin, Total 2.3 1.5 - 4.5 g/dL   Albumin/Globulin Ratio 1.9 1.2 - 2.2   Bilirubin Total 0.5 0.0 - 1.2 mg/dL   Alkaline Phosphatase 103 39 - 117 IU/L   AST 26 0 - 40 IU/L   ALT 34 (H) 0 - 32 IU/L      Assessment & Plan:   Problem List Items Addressed This Visit      Cardiovascular and Mediastinum   Benign hypertension - Primary    Not at goal, but intolerant to any BP medications. Work on stress reduction, diet and exercise. Recheck at upcoming CPE        Respiratory   COPD (chronic obstructive pulmonary disease) (HCC)    Stable without recent flares, continue current regimen      Relevant Medications   budesonide-formoterol (SYMBICORT) 160-4.5 MCG/ACT inhaler     Endocrine   Hypothyroidism    Normal TSH ~6 months ago, continue current regimen          Follow up plan: Return for CPE.

## 2018-11-04 NOTE — Assessment & Plan Note (Signed)
Normal TSH ~6 months ago, continue current regimen

## 2018-12-03 ENCOUNTER — Ambulatory Visit (INDEPENDENT_AMBULATORY_CARE_PROVIDER_SITE_OTHER): Payer: BLUE CROSS/BLUE SHIELD | Admitting: Family Medicine

## 2018-12-03 ENCOUNTER — Encounter: Payer: Self-pay | Admitting: Family Medicine

## 2018-12-03 VITALS — BP 169/94 | HR 71 | Temp 98.3°F | Ht 68.0 in | Wt 230.0 lb

## 2018-12-03 DIAGNOSIS — E782 Mixed hyperlipidemia: Secondary | ICD-10-CM

## 2018-12-03 DIAGNOSIS — Z0000 Encounter for general adult medical examination without abnormal findings: Secondary | ICD-10-CM | POA: Diagnosis not present

## 2018-12-03 DIAGNOSIS — J449 Chronic obstructive pulmonary disease, unspecified: Secondary | ICD-10-CM | POA: Diagnosis not present

## 2018-12-03 DIAGNOSIS — Z23 Encounter for immunization: Secondary | ICD-10-CM | POA: Diagnosis not present

## 2018-12-03 DIAGNOSIS — Z1239 Encounter for other screening for malignant neoplasm of breast: Secondary | ICD-10-CM

## 2018-12-03 DIAGNOSIS — Z1211 Encounter for screening for malignant neoplasm of colon: Secondary | ICD-10-CM | POA: Diagnosis not present

## 2018-12-03 DIAGNOSIS — E039 Hypothyroidism, unspecified: Secondary | ICD-10-CM

## 2018-12-03 DIAGNOSIS — J45909 Unspecified asthma, uncomplicated: Secondary | ICD-10-CM | POA: Diagnosis not present

## 2018-12-03 DIAGNOSIS — I10 Essential (primary) hypertension: Secondary | ICD-10-CM

## 2018-12-03 LAB — UA/M W/RFLX CULTURE, ROUTINE
Bilirubin, UA: NEGATIVE
Glucose, UA: NEGATIVE
Ketones, UA: NEGATIVE
Nitrite, UA: NEGATIVE
Protein, UA: NEGATIVE
RBC, UA: NEGATIVE
Specific Gravity, UA: 1.02 (ref 1.005–1.030)
UUROB: 0.2 mg/dL (ref 0.2–1.0)
pH, UA: 6.5 (ref 5.0–7.5)

## 2018-12-03 LAB — MICROSCOPIC EXAMINATION

## 2018-12-03 MED ORDER — UMECLIDINIUM-VILANTEROL 62.5-25 MCG/INH IN AEPB: 1.0000 | INHALATION_SPRAY | Freq: Every day | RESPIRATORY_TRACT | 3 refills | Status: DC

## 2018-12-03 MED ORDER — AMLODIPINE BESYLATE 2.5 MG PO TABS: 2.5000 mg | ORAL_TABLET | Freq: Every day | ORAL | 0 refills | Status: DC

## 2018-12-03 NOTE — Patient Instructions (Addendum)
Call to schedule mammogram - [place] - [phone]  Start Anoro in addition to your current inhaler regimen  Start 2.5 mg amlodipine and log home blood pressures  You should get a call to set up your consultation for another colonoscopy    Tdap Vaccine (Tetanus, Diphtheria and Pertussis): What You Need to Know 1. Why get vaccinated? Tetanus, diphtheria and pertussis are very serious diseases. Tdap vaccine can protect [place] from these diseases. And, Tdap vaccine given to pregnant women can protect newborn babies against pertussis. TETANUS (Lockjaw) is rare in the [place] today. It causes painful muscle tightening and stiffness, usually all over the body.  It can lead to tightening of muscles in the head and neck so you can't open your mouth, swallow, or sometimes even breathe. Tetanus kills about 1 out of 10 people who are infected even after receiving the best medical care.  DIPHTHERIA is also rare in the [place] today. It can cause a thick coating to form in the back of the throat.  It can lead to breathing problems, heart failure, paralysis, and death.  PERTUSSIS (Whooping Cough) causes severe coughing spells, which can cause difficulty breathing, vomiting and disturbed sleep.  It can also lead to weight loss, incontinence, and rib fractures. Up to 2 in 100 adolescents and 5 in 100 adults with pertussis are hospitalized or have complications, which could include pneumonia or death.  These diseases are caused by bacteria. Diphtheria and pertussis are spread from person to person through secretions from coughing or sneezing. Tetanus enters the body through cuts, scratches, or wounds. Before vaccines, as many as 200,000 cases of diphtheria, 200,000 cases of pertussis, and hundreds of cases of tetanus, were reported in the [place] each year. Since vaccination began, reports of cases for tetanus and diphtheria have dropped by about 99% and for pertussis by  about 80%. 2. Tdap vaccine Tdap vaccine can protect adolescents and adults from tetanus, diphtheria, and pertussis. One dose of Tdap is routinely given at age 104 or 5. People who did not get Tdap at that age should get it as soon as possible. Tdap is especially important for healthcare professionals and anyone having close contact with a baby younger than 12 months. Pregnant women should get a dose of Tdap during every pregnancy, to protect the newborn from pertussis. Infants are most at risk for severe, life-threatening complications from pertussis. Another vaccine, called Td, protects against tetanus and diphtheria, but not pertussis. A Td booster should be given every 10 years. Tdap may be given as one of these boosters if you have never gotten Tdap before. Tdap may also be given after a severe cut or burn to prevent tetanus infection. Your doctor or the person giving you the vaccine can give you more information. Tdap may safely be given at the same time as other vaccines. 3. Some people should not get this vaccine  A person who has ever had a life-threatening allergic reaction after a previous dose of any diphtheria, tetanus or pertussis containing vaccine, OR has a severe allergy to any part of this vaccine, should not get Tdap vaccine. Tell the person giving the vaccine about any severe allergies.  Anyone who had coma or long repeated seizures within 7 days after a childhood dose of DTP or DTaP, or a previous dose of Tdap, should not get Tdap, unless a cause other than the vaccine was found. They can still get Td.  Talk to your doctor if you: ?  have seizures or another nervous system problem, ? had severe pain or swelling after any vaccine containing diphtheria, tetanus or pertussis, ? ever had a condition called Guillain-Barr Syndrome (GBS), ? aren't feeling well on the day the shot is scheduled. 4. Risks With any medicine, including vaccines, there is a chance of side effects. These  are usually mild and go away on their own. Serious reactions are also possible but are rare. Most people who get Tdap vaccine do not have any problems with it. Mild problems following Tdap: (Did not interfere with activities)  Pain where the shot was given (about 3 in 4 adolescents or 2 in 3 adults)  Redness or swelling where the shot was given (about 1 person in 5)  Mild fever of at least 100.71F (up to about 1 in 25 adolescents or 1 in 100 adults)  Headache (about 3 or 4 people in 10)  Tiredness (about 1 person in 3 or 4)  Nausea, vomiting, diarrhea, stomach ache (up to 1 in 4 adolescents or 1 in 10 adults)  Chills, sore joints (about 1 person in 10)  Body aches (about 1 person in 3 or 4)  Rash, swollen glands (uncommon)  Moderate problems following Tdap: (Interfered with activities, but did not require medical attention)  Pain where the shot was given (up to 1 in 5 or 6)  Redness or swelling where the shot was given (up to about 1 in 16 adolescents or 1 in 12 adults)  Fever over 102F (about 1 in 100 adolescents or 1 in 250 adults)  Headache (about 1 in 7 adolescents or 1 in 10 adults)  Nausea, vomiting, diarrhea, stomach ache (up to 1 or 3 people in 100)  Swelling of the entire arm where the shot was given (up to about 1 in 500).  Severe problems following Tdap: (Unable to perform usual activities; required medical attention)  Swelling, severe pain, bleeding and redness in the arm where the shot was given (rare).  Problems that could happen after any vaccine:  People sometimes faint after a medical procedure, including vaccination. Sitting or lying down for about 15 minutes can help prevent fainting, and injuries caused by a fall. Tell your doctor if you feel dizzy, or have vision changes or ringing in the ears.  Some people get severe pain in the shoulder and have difficulty moving the arm where a shot was given. This happens very rarely.  Any medication can  cause a severe allergic reaction. Such reactions from a vaccine are very rare, estimated at fewer than 1 in a million doses, and would happen within a few minutes to a few hours after the vaccination. As with any medicine, there is a very remote chance of a vaccine causing a serious injury or death. The safety of vaccines is always being monitored. For more information, visit: http://www.aguilar.org/ 5. What if there is a serious problem? What should I look for? Look for anything that concerns you, such as signs of a severe allergic reaction, very high fever, or unusual behavior. Signs of a severe allergic reaction can include hives, swelling of the face and throat, difficulty breathing, a fast heartbeat, dizziness, and weakness. These would usually start a few minutes to a few hours after the vaccination. What should I do?  If you think it is a severe allergic reaction or other emergency that can't wait, call 9-1-1 or get the person to the nearest hospital. Otherwise, call your doctor.  Afterward, the reaction should be reported  to the Vaccine Adverse Event Reporting System (VAERS). Your doctor might file this report, or you can do it yourself through the VAERS web site at www.vaers.SamedayNews.es, or by calling 4018488350. ? VAERS does not give medical advice. 6. The National Vaccine Injury Compensation Program The [employer] Vaccine Injury Compensation Program (VICP) is a federal program that was created to compensate people who may have been injured by certain vaccines. Persons who believe they may have been injured by a vaccine can learn about the program and about filing a claim by calling 3108867946 or visiting the [employer] website at GoldCloset.com.ee. There is a time limit to file a claim for compensation. 7. How can I learn more?  Ask your doctor. He or she can give you the vaccine package insert or suggest other sources of information.  Call your local or state health  department.  Contact the Centers for Disease Control and Prevention (CDC): ? Call 256-689-6149 (1-800-CDC-INFO) or ? Visit CDC's website at http://hunter.com/ CDC Tdap Vaccine VIS (02/14/14) This information is not intended to replace advice given to you by your health care provider. Make sure you discuss any questions you have with your health care provider. Document Released: 06/08/2012 Document Revised: 08/28/2016 Document Reviewed: 08/28/2016 Elsevier Interactive Patient Education  2017 [employer].

## 2018-12-03 NOTE — Progress Notes (Signed)
BP (!) 169/94 (BP Location: Right Arm, Cuff Size: Normal)   Pulse 71   Temp 98.3 F (36.8 C) (Oral)   Ht 5\' 8"  (1.727 m)   Wt 230 lb (104.3 kg)   LMP 02/05/1991 (Approximate)   SpO2 95%   BMI 34.97 kg/m    Subjective:    Patient ID: [name], female    DOB: 11/05/1961, 57 y.o.   MRN: [id number]  HPI: [name] is a 57 y.o. female presenting on 12/03/2018 for comprehensive medical examination. Current medical complaints include:see below  HTN - Just started back at the gym and trying hard to lose weight. Has not been checking home BPs. Has had side effects with HCTZ, losartan, and lisinopril in the past and had tom come off because of side effects - feeling like she's going to faint. Has been off medicines for 4-5 years now. Feels like her BPs have been running high lately - flushed, headache, hearing pulse in ears. Denies CP, dizziness.   COPD - not under good control with fairly consistent use of symbicort. Has xopenex at home but does not ever use it. Former smoker. No recent exacerbations but having frequent cough especially at night and some chest tightness with activity.   Taking synthroid for hypothyroidism, tolerating well without apparent sxs.   She currently lives with: Menopausal Symptoms: no  Depression Screen done today and results listed below:  Depression screen [facility] 2/9 12/03/2018 06/12/2017 06/12/2017 05/09/2016  Decreased Interest 0 0 0 0  Down, Depressed, Hopeless 0 0 0 0  PHQ - 2 Score 0 0 0 0  Altered sleeping 0 - - -  Tired, decreased energy 0 - - -  Change in appetite 0 - - -  Feeling bad or failure about yourself  0 - - -  Trouble concentrating 0 - - -  Moving slowly or fidgety/restless 0 - - -  Suicidal thoughts 0 - - -  PHQ-9 Score 0 - - -  Difficult doing work/chores Not difficult at all - - -    The patient does not have a history of falls. I did not complete a risk assessment for falls. A plan of care for falls was not  documented.   Past Medical History:  Past Medical History:  Diagnosis Date  . Asthma   . COPD (chronic obstructive pulmonary disease) ([place])   . GERD (gastroesophageal reflux disease)   . Hyperlipidemia   . Hypertension   . IFG (impaired fasting glucose)   . Obesity   . Peripheral vertigo   . Thyroid disease     Surgical History:  Past Surgical History:  Procedure Laterality Date  . ABDOMINAL HYSTERECTOMY      Medications:  Current Outpatient Medications on File Prior to Visit  Medication Sig  . aspirin 81 MG tablet Take 81 mg by mouth daily.  . budesonide-formoterol (SYMBICORT) 160-4.5 MCG/ACT inhaler Inhale 2 puffs into the lungs 2 (two) times daily.  Marland Kitchen levothyroxine (SYNTHROID, LEVOTHROID) 50 MCG tablet Take 1 tablet (50 mcg total) by mouth daily.  Marland Kitchen levalbuterol (XOPENEX HFA) 45 MCG/ACT inhaler Inhale 2 puffs into the lungs every 4 (four) hours as needed for wheezing. (Patient not taking: Reported on 11/04/2018)   No current facility-administered medications on file prior to visit.     Allergies:  No Known Allergies  Social History:  Social History   Socioeconomic History  . Marital status: Divorced    Spouse name: Not on file  . Number  of children: Not on file  . Years of education: Not on file  . Highest education level: Not on file  Occupational History  . Not on file  Social Needs  . Financial resource strain: Not on file  . Food insecurity:    Worry: Not on file    Inability: Not on file  . Transportation needs:    Medical: Not on file    Non-medical: Not on file  Tobacco Use  . Smoking status: Former Research scientist (life sciences)  . Smokeless tobacco: Never Used  Substance and Sexual Activity  . Alcohol use: No    Alcohol/week: 0.0 standard drinks  . Drug use: No  . Sexual activity: Yes  Lifestyle  . Physical activity:    Days per week: Not on file    Minutes per session: Not on file  . Stress: Not on file  Relationships  . Social connections:    Talks on  phone: Not on file    Gets together: Not on file    Attends religious service: Not on file    Active member of club or organization: Not on file    Attends meetings of clubs or organizations: Not on file    Relationship status: Not on file  . Intimate partner violence:    Fear of current or ex partner: Not on file    Emotionally abused: Not on file    Physically abused: Not on file    Forced sexual activity: Not on file  Other Topics Concern  . Not on file  Social History Narrative  . Not on file   Social History   Tobacco Use  Smoking Status Former Smoker  Smokeless Tobacco Never Used   Social History   Substance and Sexual Activity  Alcohol Use No  . Alcohol/week: 0.0 standard drinks    Family History:  Family History  Problem Relation Age of Onset  . Cancer Mother        colon  . Stroke Mother   . Cancer Father        brain  . Heart disease Father   . Heart disease Maternal Grandmother   . Cancer Maternal Grandfather        colon    Past medical history, surgical history, medications, allergies, family history and social history reviewed with patient today and changes made to appropriate areas of the chart.   Review of Systems - General ROS: negative Psychological ROS: negative Ophthalmic ROS: negative ENT ROS: negative Allergy and Immunology ROS: negative Hematological and Lymphatic ROS: negative Endocrine ROS: negative Breast ROS: negative for breast lumps Respiratory ROS: positive for - cough and shortness of breath Cardiovascular ROS: no chest pain or dyspnea on exertion Gastrointestinal ROS: no abdominal pain, change in bowel habits, or black or bloody stools Genito-Urinary ROS: no dysuria, trouble voiding, or hematuria Musculoskeletal ROS: negative Neurological ROS: no TIA or stroke symptoms Dermatological ROS: negative All other ROS negative except what is listed above and in the HPI.      Objective:    BP (!) 169/94 (BP Location: Right Arm,  Cuff Size: Normal)   Pulse 71   Temp 98.3 F (36.8 C) (Oral)   Ht 5\' 8"  (1.727 m)   Wt 230 lb (104.3 kg)   LMP 02/05/1991 (Approximate)   SpO2 95%   BMI 34.97 kg/m   Wt Readings from Last 3 Encounters:  12/03/18 230 lb (104.3 kg)  11/04/18 231 lb (104.8 kg)  06/12/17 220 lb (99.8 kg)  Physical Exam Vitals signs and nursing note reviewed.  Constitutional:      General: She is not in acute distress.    Appearance: She is well-developed.  HENT:     Head: Atraumatic.     Right Ear: External ear normal.     Left Ear: External ear normal.     Nose: Nose normal.     Mouth/Throat:     Pharynx: No oropharyngeal exudate.  Eyes:     General: No scleral icterus.    Conjunctiva/sclera: Conjunctivae normal.     Pupils: Pupils are equal, round, and reactive to light.  Neck:     Musculoskeletal: Normal range of motion and neck supple.     Thyroid: No thyromegaly.  Cardiovascular:     Rate and Rhythm: Normal rate and regular rhythm.     Heart sounds: Normal heart sounds.  Pulmonary:     Effort: Pulmonary effort is normal. No respiratory distress.     Breath sounds: Normal breath sounds.  Chest:     Breasts:        Right: No mass, nipple discharge, skin change or tenderness.        Left: No mass, nipple discharge, skin change or tenderness.  Abdominal:     General: Bowel sounds are normal.     Palpations: Abdomen is soft. There is no mass.     Tenderness: There is no abdominal tenderness.  Musculoskeletal: Normal range of motion.        General: No tenderness.  Lymphadenopathy:     Cervical: No cervical adenopathy.  Skin:    General: Skin is warm and dry.     Findings: No rash.  Neurological:     Mental Status: She is alert and oriented to person, place, and time.     Cranial Nerves: No cranial nerve deficit.  Psychiatric:        Behavior: Behavior normal.     Results for orders placed or performed in visit on 12/03/18  Microscopic Examination  Result Value Ref Range    WBC, UA 0-5 0 - 5 /hpf   RBC, UA 0-2 0 - 2 /hpf   Epithelial Cells (non renal) 0-10 0 - 10 /hpf   Bacteria, UA Few (A) None seen/Few  CBC with Differential/Platelet  Result Value Ref Range   WBC 4.9 3.4 - 10.8 x10E3/uL   RBC 4.77 3.77 - 5.28 x10E6/uL   Hemoglobin 14.7 11.1 - 15.9 g/dL   Hematocrit 43.2 34.0 - 46.6 %   MCV 91 79 - 97 fL   MCH 30.8 26.6 - 33.0 pg   MCHC 34.0 31.5 - 35.7 g/dL   RDW 13.2 12.3 - 15.4 %   Platelets 179 150 - 450 x10E3/uL   Neutrophils 60 Not Estab. %   Lymphs 26 Not Estab. %   Monocytes 9 Not Estab. %   Eos 3 Not Estab. %   Basos 1 Not Estab. %   Neutrophils Absolute 2.9 1.4 - 7.0 x10E3/uL   Lymphocytes Absolute 1.3 0.7 - 3.1 x10E3/uL   Monocytes Absolute 0.4 0.1 - 0.9 x10E3/uL   EOS (ABSOLUTE) 0.2 0.0 - 0.4 x10E3/uL   Basophils Absolute 0.1 0.0 - 0.2 x10E3/uL   Immature Granulocytes 1 Not Estab. %   Immature Grans (Abs) 0.1 0.0 - 0.1 x10E3/uL  Comprehensive metabolic panel  Result Value Ref Range   Glucose 92 65 - 99 mg/dL   BUN 16 6 - 24 mg/dL   Creatinine, Ser 1.07 (H) 0.57 - 1.00  mg/dL   GFR calc non Af Amer 57 (L) >59 mL/min/1.73   GFR calc Af Amer 66 >59 mL/min/1.73   BUN/Creatinine Ratio 15 9 - 23   Sodium 141 134 - 144 mmol/L   Potassium 3.8 3.5 - 5.2 mmol/L   Chloride 103 96 - 106 mmol/L   CO2 25 20 - 29 mmol/L   Calcium 9.6 8.7 - 10.2 mg/dL   Total Protein 6.6 6.0 - 8.5 g/dL   Albumin 4.2 3.5 - 5.5 g/dL   Globulin, Total 2.4 1.5 - 4.5 g/dL   Albumin/Globulin Ratio 1.8 1.2 - 2.2   Bilirubin Total 0.4 0.0 - 1.2 mg/dL   Alkaline Phosphatase 104 39 - 117 IU/L   AST 33 0 - 40 IU/L   ALT 41 (H) 0 - 32 IU/L  Lipid Panel w/o Chol/HDL Ratio  Result Value Ref Range   Cholesterol, Total 178 100 - 199 mg/dL   Triglycerides 271 (H) 0 - 149 mg/dL   HDL 48 >39 mg/dL   VLDL Cholesterol Cal 54 (H) 5 - 40 mg/dL   LDL Calculated 76 0 - 99 mg/dL  TSH  Result Value Ref Range   TSH 2.910 0.450 - 4.500 uIU/mL  UA/M w/rflx Culture, Routine   Result Value Ref Range   Specific Gravity, UA 1.020 1.005 - 1.030   pH, UA 6.5 5.0 - 7.5   Color, UA Yellow Yellow   Appearance Ur Clear Clear   Leukocytes, UA Trace (A) Negative   Protein, UA Negative Negative/Trace   Glucose, UA Negative Negative   Ketones, UA Negative Negative   RBC, UA Negative Negative   Bilirubin, UA Negative Negative   Urobilinogen, Ur 0.2 0.2 - 1.0 mg/dL   Nitrite, UA Negative Negative   Microscopic Examination See below:       Assessment & Plan:   Problem List Items Addressed This Visit      Cardiovascular and Mediastinum   Benign hypertension - Primary    Will start very low dose amlodipine and monitor closely for side effects. Pt very nervous about trying a medication given issues in the past with other types of BP medications. Work hard on [employer], exercise, weight loss, stress reduction. Start home monitoring.       Relevant Medications   amLODipine (NORVASC) 2.5 MG tablet   Other Relevant Orders   CBC with Differential/Platelet (Completed)   Comprehensive metabolic panel (Completed)   UA/M w/rflx Culture, Routine (Completed)     Respiratory   Asthma   Relevant Medications   umeclidinium-vilanterol (ANORO ELLIPTA) 62.5-25 MCG/INH AEPB   COPD (chronic obstructive pulmonary disease) ([facility])    Will add anoro to symbicort and xopenex regimen. Encouraged her to use her xopenex more frequently as needed.       Relevant Medications   umeclidinium-vilanterol (ANORO ELLIPTA) 62.5-25 MCG/INH AEPB     Endocrine   Hypothyroidism    Check TSH today and adjust as needed      Relevant Orders   TSH (Completed)     Other   Hyperlipidemia    Recheck lipids. Currently diet controlled. Adjust as needed      Relevant Medications   amLODipine (NORVASC) 2.5 MG tablet   Other Relevant Orders   Lipid Panel w/o Chol/HDL Ratio (Completed)    Other Visit Diagnoses    Annual physical exam       Screening for colon cancer       Relevant Orders    Ambulatory referral to Gastroenterology  Need for Tdap vaccination       Relevant Orders   Tdap vaccine greater than or equal to 7yo IM (Completed)   Screening for breast cancer       Relevant Orders   MM DIGITAL SCREENING BILATERAL       Follow up plan: Return in about 4 weeks (around 12/31/2018) for BP.   LABORATORY TESTING:  - Pap smear: not applicable  IMMUNIZATIONS:   - Tdap: Tetanus vaccination status reviewed: last tetanus booster within 10 years. - Influenza: Up to date  SCREENING: -Mammogram: Ordered today  - Colonoscopy: Ordered today   PATIENT COUNSELING:   Advised to take 1 mg of folate supplement per day if capable of pregnancy.   Sexuality: Discussed sexually transmitted diseases, partner selection, use of condoms, avoidance of unintended pregnancy  and contraceptive alternatives.   Advised to avoid cigarette smoking.  I discussed with the patient that most people either abstain from alcohol or drink within safe limits (<=14/week and <=4 drinks/occasion for males, <=7/weeks and <= 3 drinks/occasion for females) and that the risk for alcohol disorders and other health effects rises proportionally with the number of drinks per week and how often a drinker exceeds daily limits.  Discussed cessation/primary prevention of drug use and availability of treatment for abuse.   Diet: Encouraged to adjust caloric intake to maintain  or achieve ideal body weight, to reduce intake of dietary saturated fat and total fat, to limit sodium intake by avoiding high sodium foods and not adding table salt, and to maintain adequate dietary potassium and calcium preferably from fresh fruits, vegetables, and low-fat dairy products.    stressed the importance of regular exercise  Injury prevention: Discussed safety belts, safety helmets, smoke detector, smoking near bedding or upholstery.   Dental health: Discussed importance of regular tooth brushing, flossing, and dental visits.     NEXT PREVENTATIVE PHYSICAL DUE IN 1 YEAR. Return in about 4 weeks (around 12/31/2018) for BP.

## 2018-12-04 LAB — COMPREHENSIVE METABOLIC PANEL
ALBUMIN: 4.2 g/dL (ref 3.5–5.5)
ALK PHOS: 104 IU/L (ref 39–117)
ALT: 41 IU/L — ABNORMAL HIGH (ref 0–32)
AST: 33 IU/L (ref 0–40)
Albumin/Globulin Ratio: 1.8 (ref 1.2–2.2)
BILIRUBIN TOTAL: 0.4 mg/dL (ref 0.0–1.2)
BUN / CREAT RATIO: 15 (ref 9–23)
BUN: 16 mg/dL (ref 6–24)
CO2: 25 mmol/L (ref 20–29)
CREATININE: 1.07 mg/dL — AB (ref 0.57–1.00)
Calcium: 9.6 mg/dL (ref 8.7–10.2)
Chloride: 103 mmol/L (ref 96–106)
GFR calc Af Amer: 66 mL/min/{1.73_m2} (ref >59)
GFR calc non Af Amer: 57 mL/min/{1.73_m2} — ABNORMAL LOW (ref >59)
GLOBULIN, TOTAL: 2.4 g/dL (ref 1.5–4.5)
GLUCOSE: 92 mg/dL (ref 65–99)
Potassium: 3.8 mmol/L (ref 3.5–5.2)
Sodium: 141 mmol/L (ref 134–144)
Total Protein: 6.6 g/dL (ref 6.0–8.5)

## 2018-12-04 LAB — LIPID PANEL W/O CHOL/HDL RATIO
Cholesterol, Total: 178 mg/dL (ref 100–199)
HDL: 48 mg/dL (ref >39)
LDL CALC: 76 mg/dL (ref 0–99)
Triglycerides: 271 mg/dL — ABNORMAL HIGH (ref 0–149)
VLDL CHOLESTEROL CAL: 54 mg/dL — AB (ref 5–40)

## 2018-12-04 LAB — CBC WITH DIFFERENTIAL/PLATELET
BASOS ABS: 0.1 10*3/uL (ref 0.0–0.2)
Basos: 1 %
EOS (ABSOLUTE): 0.2 10*3/uL (ref 0.0–0.4)
EOS: 3 %
HEMATOCRIT: 43.2 % (ref 34.0–46.6)
HEMOGLOBIN: 14.7 g/dL (ref 11.1–15.9)
Immature Grans (Abs): 0.1 10*3/uL (ref 0.0–0.1)
Immature Granulocytes: 1 %
LYMPHS ABS: 1.3 10*3/uL (ref 0.7–3.1)
Lymphs: 26 %
MCH: 30.8 pg (ref 26.6–33.0)
MCHC: 34 g/dL (ref 31.5–35.7)
MCV: 91 fL (ref 79–97)
MONOCYTES: 9 %
Monocytes Absolute: 0.4 10*3/uL (ref 0.1–0.9)
NEUTROS ABS: 2.9 10*3/uL (ref 1.4–7.0)
Neutrophils: 60 %
Platelets: 179 10*3/uL (ref 150–450)
RBC: 4.77 x10E6/uL (ref 3.77–5.28)
RDW: 13.2 % (ref 12.3–15.4)
WBC: 4.9 10*3/uL (ref 3.4–10.8)

## 2018-12-04 LAB — TSH: TSH: 2.91 u[IU]/mL (ref 0.450–4.500)

## 2018-12-04 NOTE — Assessment & Plan Note (Signed)
Check TSH today and adjust as needed

## 2018-12-04 NOTE — Assessment & Plan Note (Signed)
Recheck lipids. Currently diet controlled. Adjust as needed

## 2018-12-04 NOTE — Assessment & Plan Note (Signed)
Will add anoro to symbicort and xopenex regimen. Encouraged her to use her xopenex more frequently as needed.

## 2018-12-04 NOTE — Assessment & Plan Note (Addendum)
Will start very low dose amlodipine and monitor closely for side effects. Pt very nervous about trying a medication given issues in the past with other types of BP medications. Work hard on [employer], exercise, weight loss, stress reduction. Start home monitoring.

## 2018-12-06 ENCOUNTER — Telehealth: Payer: Self-pay

## 2018-12-06 NOTE — Telephone Encounter (Signed)
Pt is returning, Michelle's call. Referral

## 2018-12-10 NOTE — Telephone Encounter (Signed)
Yes the patient had a barium enema in 2009 after the scope could not be passed beyond the sigmoid. She can be set up for another barium enema or she can try another colonoscopy. Ten years have gone by and her colon could have changed.

## 2018-12-10 NOTE — Telephone Encounter (Signed)
Returned patients call to schedule her for her colonoscopy.  She stated that in the past  when she had a colonoscopy, the doctors Allen Norris on 01/17/08 and [place] 01/18/08) were unable to get to the scope through to do colonoscopy due to enlarged diverticulum and that another procedure would need to be done next time instead of colonoscopy.   I informed her that I would ask you to review the chart and discuss with Dr. Allen Norris what the other procedure is, and if an office visit is required.   I asked if she would be okay if I had to call her back after Christmas and she is fine with that.  Thanks [employer]

## 2018-12-16 ENCOUNTER — Other Ambulatory Visit: Payer: Self-pay

## 2018-12-16 DIAGNOSIS — Z1211 Encounter for screening for malignant neoplasm of colon: Secondary | ICD-10-CM

## 2019-01-03 ENCOUNTER — Telehealth: Payer: Self-pay | Admitting: Gastroenterology

## 2019-01-03 NOTE — Telephone Encounter (Signed)
Patient called & l/m on machine to cancel her colonoscopy with Dr Allen Norris on 01-11-2019 because change of insurance. Once she get everything worked out she will call & reschedule.

## 2019-01-04 NOTE — Telephone Encounter (Signed)
Patient contacted office to cancel her colonoscopy with Dr. Allen Norris 01/11/19  due to insurance changes.  Trish in Endoscopy has been informed of cancellation.  This has been noted in referral.  Thanks Sharyn Lull

## 2019-01-06 ENCOUNTER — Ambulatory Visit: Payer: BLUE CROSS/BLUE SHIELD | Admitting: Family Medicine

## 2019-01-11 ENCOUNTER — Ambulatory Visit: Admit: 2019-01-11 | Payer: Self-pay | Admitting: Gastroenterology

## 2019-01-11 SURGERY — COLONOSCOPY WITH PROPOFOL
Anesthesia: General

## 2019-05-31 ENCOUNTER — Other Ambulatory Visit: Payer: Self-pay | Admitting: Internal Medicine

## 2019-05-31 ENCOUNTER — Other Ambulatory Visit: Payer: Self-pay

## 2019-05-31 DIAGNOSIS — Z20822 Contact with and (suspected) exposure to covid-19: Secondary | ICD-10-CM

## 2019-06-20 LAB — NOVEL CORONAVIRUS, NAA: SARS-CoV-2, NAA: NOT DETECTED

## 2021-01-31 ENCOUNTER — Encounter: Payer: Self-pay | Admitting: Internal Medicine

## 2021-03-06 ENCOUNTER — Other Ambulatory Visit: Payer: Self-pay

## 2021-03-06 ENCOUNTER — Encounter: Payer: Self-pay | Admitting: Internal Medicine

## 2021-03-06 ENCOUNTER — Ambulatory Visit (INDEPENDENT_AMBULATORY_CARE_PROVIDER_SITE_OTHER): Payer: 59 | Admitting: Internal Medicine

## 2021-03-06 VITALS — BP 170/96 | HR 90 | Temp 97.0°F | Ht 67.0 in | Wt 233.0 lb

## 2021-03-06 DIAGNOSIS — Z1211 Encounter for screening for malignant neoplasm of colon: Secondary | ICD-10-CM | POA: Diagnosis not present

## 2021-03-06 DIAGNOSIS — Z800 Family history of malignant neoplasm of digestive organs: Secondary | ICD-10-CM

## 2021-03-06 MED ORDER — PEG 3350-KCL-NA BICARB-NACL 420 G PO SOLR: 4000.0000 mL | ORAL | 0 refills | Status: DC

## 2021-03-06 NOTE — Progress Notes (Signed)
Primary Care Physician:  Scherrie Bateman Primary Gastroenterologist:  Dr. Abbey Chatters  Chief Complaint  Patient presents with  . Consult    TCS done about 10 years ago in [place]. Mother colon cancer age 62's, mgf colon cancer    HPI:   [name] is a 60 y.o. female who presents to the clinic today by referral from her PCP Delman Cheadle to discuss colonoscopy.  Patient states her last colonoscopy was 10 years ago in [place].  Also notes a family history of colon cancer in her mother and maternal grandfather.  Patient denies any abdominal pain or discomfort.  No melena hematochezia.  No unintentional weight loss.  No upper GI symptoms.  No heartburn or reflux.  No odynophagia or dysphagia.  Otherwise she has no other complaints.  Past Medical History:  Diagnosis Date  . Asthma   . COPD (chronic obstructive pulmonary disease) ([place])   . GERD (gastroesophageal reflux disease)   . Hyperlipidemia   . Hypertension   . IFG (impaired fasting glucose)   . Obesity   . Peripheral vertigo   . Thyroid disease     Past Surgical History:  Procedure Laterality Date  . ABDOMINAL HYSTERECTOMY      Current Outpatient Medications  Medication Sig Dispense Refill  . albuterol (VENTOLIN HFA) 108 (90 Base) MCG/ACT inhaler Inhale into the lungs every 6 (six) hours as needed for wheezing or shortness of breath.    Marland Kitchen ibuprofen (ADVIL) 200 MG tablet Take 200 mg by mouth every 6 (six) hours as needed.     No current facility-administered medications for this visit.    Allergies as of 03/06/2021  . (No Known Allergies)    Family History  Problem Relation Age of Onset  . Cancer Mother        colon  . Stroke Mother   . Cancer Father        brain  . Heart disease Father   . Heart disease Maternal Grandmother   . Cancer Maternal Grandfather        colon    Social History   Socioeconomic History  . Marital status: Divorced    Spouse name: Not on file  . Number of  children: Not on file  . Years of education: Not on file  . Highest education level: Not on file  Occupational History  . Not on file  Tobacco Use  . Smoking status: Former Research scientist (life sciences)  . Smokeless tobacco: Never Used  Vaping Use  . Vaping Use: Never used  Substance and Sexual Activity  . Alcohol use: No    Alcohol/week: 0.0 standard drinks  . Drug use: No  . Sexual activity: Yes  Other Topics Concern  . Not on file  Social History Narrative  . Not on file   Social Determinants of Health   Financial Resource Strain: Not on file  Food Insecurity: Not on file  Transportation Needs: Not on file  Physical Activity: Not on file  Stress: Not on file  Social Connections: Not on file  Intimate Partner Violence: Not on file    Subjective: Review of Systems  Constitutional: Negative for chills and fever.  HENT: Negative for congestion and hearing loss.   Eyes: Negative for blurred vision and double vision.  Respiratory: Negative for cough and shortness of breath.   Cardiovascular: Negative for chest pain and palpitations.  Gastrointestinal: Negative for abdominal pain, blood in stool, constipation, diarrhea, heartburn, melena and vomiting.  Genitourinary: Negative  for dysuria and urgency.  Musculoskeletal: Negative for joint pain and myalgias.  Skin: Negative for itching and rash.  Neurological: Negative for dizziness and headaches.  Psychiatric/Behavioral: Negative for depression. The patient is not nervous/anxious.        Objective: BP (!) 183/102   Pulse 90   Temp (!) 97 F (36.1 C)   Ht 5\' 7"  (1.702 m)   Wt 233 lb (105.7 kg)   LMP 02/05/1991 (Approximate)   BMI 36.49 kg/m  Physical Exam Constitutional:      Appearance: Normal appearance.  HENT:     Head: Normocephalic and atraumatic.  Eyes:     Extraocular Movements: Extraocular movements intact.     Conjunctiva/sclera: Conjunctivae normal.  Cardiovascular:     Rate and Rhythm: Normal rate and regular rhythm.   Pulmonary:     Effort: Pulmonary effort is normal.     Breath sounds: Normal breath sounds.  Abdominal:     General: Bowel sounds are normal.     Palpations: Abdomen is soft.  Musculoskeletal:        General: No swelling. Normal range of motion.     Cervical back: Normal range of motion and neck supple.  Skin:    General: Skin is warm and dry.     Coloration: Skin is not jaundiced.  Neurological:     General: No focal deficit present.     Mental Status: She is alert and oriented to person, place, and time.  Psychiatric:        Mood and Affect: Mood normal.        Behavior: Behavior normal.      Assessment: *Colon cancer screening *Family history of colon cancer-mother and grandfather  Plan: Will schedule for screening colonoscopy.The risks including infection, bleed, or perforation as well as benefits, limitations, alternatives and imponderables have been reviewed with the patient. Questions have been answered. All parties agreeable.  Thank you Delman Cheadle for the kind referral.  03/06/2021 2:06 PM   Disclaimer: This note was dictated with voice recognition software. Similar sounding words can inadvertently be transcribed and may not be corrected upon review.

## 2021-03-06 NOTE — Patient Instructions (Signed)
We will schedule you for screening colonoscopy today in clinic.  Further recommendations to follow.  At [facility] we value your feedback. You may receive a survey about your visit today. Please share your experience as we strive to create trusting relationships with our patients to provide genuine, compassionate, quality care.  We appreciate your understanding and patience as we review any laboratory studies, imaging, and other diagnostic tests that are ordered as we care for you. Our office policy is 5 business days for review of these results, and any emergent or urgent results are addressed in a timely manner for your best interest. If you do not hear from our office in 1 week, please contact us.   We also encourage the use of MyChart, which contains your medical information for your review as well. If you are not enrolled in this feature, an access code is on this after visit summary for your convenience. Thank you for allowing us to be involved in your care.  It was great to see you today!  I hope you have a great rest of your spring!!    Vernice Mannina K. Sulaiman Imbert, D.O. Gastroenterology and Hepatology [facility]  

## 2021-03-26 ENCOUNTER — Telehealth: Payer: Self-pay | Admitting: Internal Medicine

## 2021-03-26 NOTE — Telephone Encounter (Signed)
Pt wants to reschedule her procedure on 4/18 with Dr Abbey Chatters. [phone]

## 2021-03-26 NOTE — Telephone Encounter (Signed)
COVID test appt letter mailed with new procedure instructions.

## 2021-03-26 NOTE — Telephone Encounter (Signed)
Called pt, TCS rescheduled to 05/06/21--AM. COVID test changed to 05/03/21 1:30pm. Endo scheduler informed.

## 2021-04-04 ENCOUNTER — Other Ambulatory Visit (HOSPITAL_COMMUNITY): Payer: 59

## 2021-05-01 ENCOUNTER — Telehealth: Payer: Self-pay | Admitting: *Deleted

## 2021-05-01 NOTE — Telephone Encounter (Signed)
Patient called in. She wants to cancel procedure with Dr. Abbey Chatters on Monday. She did not want to r/s at this time. She states she will call back at a later date. Message sent to endo to make aware.

## 2021-05-03 ENCOUNTER — Other Ambulatory Visit (HOSPITAL_COMMUNITY): Payer: 59

## 2021-05-06 ENCOUNTER — Encounter (HOSPITAL_COMMUNITY): Admission: RE | Payer: Self-pay | Source: Home / Self Care

## 2021-05-06 ENCOUNTER — Ambulatory Visit (HOSPITAL_COMMUNITY): Admission: RE | Admit: 2021-05-06 | Payer: 59 | Source: Home / Self Care

## 2021-05-06 SURGERY — COLONOSCOPY WITH PROPOFOL
Anesthesia: Monitor Anesthesia Care

## 2021-07-31 ENCOUNTER — Encounter: Payer: Self-pay | Admitting: Internal Medicine

## 2021-09-25 ENCOUNTER — Encounter: Payer: Self-pay | Admitting: Internal Medicine

## 2021-09-25 ENCOUNTER — Ambulatory Visit: Payer: 59

## 2021-12-22 DIAGNOSIS — G709 Myoneural disorder, unspecified: Secondary | ICD-10-CM

## 2021-12-22 HISTORY — DX: Myoneural disorder, unspecified: G70.9

## 2022-02-27 ENCOUNTER — Telehealth: Payer: Self-pay | Admitting: Family Medicine

## 2022-02-27 DIAGNOSIS — J4521 Mild intermittent asthma with (acute) exacerbation: Secondary | ICD-10-CM

## 2022-02-27 MED ORDER — PREDNISONE 20 MG PO TABS: 40.0000 mg | ORAL_TABLET | Freq: Every day | ORAL | 0 refills | Status: AC

## 2022-02-27 NOTE — Progress Notes (Signed)
Visit for Asthma ? ?Based on what you have shared with me, it looks like you may have a flare up of your asthma.  Asthma is a chronic (ongoing) lung disease which results in airway obstruction, inflammation and hyper-responsiveness.  ? ?Asthma symptoms vary from person to person, with common symptoms including nighttime awakening and decreased ability to participate in normal activities as a result of shortness of breath. It is often triggered by changes in weather, changes in the season, changes in air temperature, or inside (home, school, daycare or work) allergens such as animal dander, mold, mildew, woodstoves or cockroaches.   It can also be triggered by hormonal changes, extreme emotion, physical exertion or an upper respiratory tract illness.    ? ?It is important to identify the trigger, and then eliminate or avoid the trigger if possible.  ? ?If you have been prescribed medications to be taken on a regular basis, it is important to follow the asthma action plan and to follow guidelines to adjust medication in response to increasing symptoms of decreased peak expiratory flow rate ? ?Treatment: ?I have prescribed: Prednisone '40mg'$  by mouth per day for 5 - 7 days ? ?I would also pick up some Mucinex DM to help with coughing up the mucus is needed and to calm the cough. ? ?HOME CARE ?Only take medications as instructed by your medical team. ?Consider wearing a mask or scarf to improve breathing air temperature have been shown to decrease irritation and decrease exacerbations ?Get rest. ?Taking a steamy shower or using a humidifier may help nasal congestion sand ease sore throat pain. You can place a towel over your head and breathe in the steam from hot water coming from a faucet. ?Using a saline nasal spray works much the same way.  ?Cough drops, hare candies and sore throat lozenges may ease your  cough.  ?Avoid close contacts especially the very you and the elderly ?Cover your mouth if you cough or sneeze ?Always remember to wash your hands.  ? ? ?GET HELP RIGHT AWAY IF: ?You develop worsening symptoms; breathlessness at rest, drowsy, confused or agitated, unable to speak in full sentences ?You have coughing fits ?You develop a severe headache or visual changes ?You develop shortness of breath, difficulty breathing or start having chest pain ?Your symptoms persist after you have completed your treatment plan ?If your symptoms do not improve within 10 days ? ?MAKE SURE YOU ?Understand these instructions. ?Will watch your condition. ?Will get help right away if you are not doing well or get worse.  ? ?Your e-visit answers were reviewed by a board certified advanced clinical practitioner to complete your personal care plan, Depending upon the condition, your plan could have included both over the counter or prescription medications.  ? ?Please review your pharmacy choice. ?Your safety is important to [place]. If you have drug allergies check your prescription carefully. ? ?You can use MyChart to ask questions about today's visit, request a non-urgent  ?call back, or ask for a work or school excuse for 24 hours related to this e-Visit. If it has been greater than 24 hours you will need to follow up with your provider, or enter a new e-Visit to address those concerns.  ? ?You will get an e-mail in the next two days asking about your experience. I hope that your e-visit has been valuable and will speed your recovery. Thank you for using e-visits. ? ?I provided 5 minutes of non face-to-face time during this encounter for  chart review, medication and order placement, as well as and documentation.  ? ?

## 2022-03-03 ENCOUNTER — Other Ambulatory Visit: Payer: Self-pay | Admitting: Family Medicine

## 2022-03-03 DIAGNOSIS — J4521 Mild intermittent asthma with (acute) exacerbation: Secondary | ICD-10-CM

## 2022-03-03 NOTE — Telephone Encounter (Signed)
The refill request was sent to me by mistake. Thanks.

## 2022-05-21 ENCOUNTER — Ambulatory Visit: Payer: 59 | Admitting: Neurology

## 2022-07-07 ENCOUNTER — Encounter: Payer: Self-pay | Admitting: *Deleted

## 2022-07-14 ENCOUNTER — Ambulatory Visit: Payer: 59 | Admitting: Internal Medicine

## 2022-07-15 ENCOUNTER — Ambulatory Visit (INDEPENDENT_AMBULATORY_CARE_PROVIDER_SITE_OTHER): Payer: 59 | Admitting: Physician Assistant

## 2022-07-15 VITALS — BP 163/84 | HR 74

## 2022-07-15 DIAGNOSIS — N3946 Mixed incontinence: Secondary | ICD-10-CM

## 2022-07-15 DIAGNOSIS — Z8541 Personal history of malignant neoplasm of cervix uteri: Secondary | ICD-10-CM

## 2022-07-15 DIAGNOSIS — R350 Frequency of micturition: Secondary | ICD-10-CM | POA: Diagnosis not present

## 2022-07-15 DIAGNOSIS — R310 Gross hematuria: Secondary | ICD-10-CM

## 2022-07-15 DIAGNOSIS — R7989 Other specified abnormal findings of blood chemistry: Secondary | ICD-10-CM

## 2022-07-15 DIAGNOSIS — R339 Retention of urine, unspecified: Secondary | ICD-10-CM | POA: Diagnosis not present

## 2022-07-15 DIAGNOSIS — Z87891 Personal history of nicotine dependence: Secondary | ICD-10-CM

## 2022-07-15 LAB — BLADDER SCAN AMB NON-IMAGING: Scan Result: 194

## 2022-07-15 MED ORDER — PHENAZOPYRIDINE HCL 100 MG PO TABS: 100.0000 mg | ORAL_TABLET | Freq: Three times a day (TID) | ORAL | 0 refills | Status: DC | PRN

## 2022-07-15 NOTE — Progress Notes (Signed)
07/15/2022 2:25 PM   [name] [birth year] [id number]   Assessment:  1. Urinary frequency - Urinalysis, Routine w reflex microscopic - BLADDER SCAN AMB NON-IMAGING    Plan: ***  Chief Complaint: No chief complaint on file.   Referring provider:  Jake Samples, PA-C [address] [place],  [place] [address]   History of Present Illness:  [name] is a 61 y.o. year old female who is seen in consultation from Jake Samples, [place]  for evaluation of .     Past Medical History:  Past Medical History:  Diagnosis Date   Asthma    COPD (chronic obstructive pulmonary disease) ([facility])    GERD (gastroesophageal reflux disease)    Hyperlipidemia    Hypertension    IFG (impaired fasting glucose)    Obesity    Peripheral vertigo    Thyroid disease     Past Surgical History:  Past Surgical History:  Procedure Laterality Date   ABDOMINAL HYSTERECTOMY      Allergies:  No Known Allergies  Family History:  Family History  Problem Relation Age of Onset   Cancer Mother        colon   Stroke Mother    Cancer Father        brain   Heart disease Father    Heart disease Maternal Grandmother    Cancer Maternal Grandfather        colon    Social History:  Social History   Tobacco Use   Smoking status: Former   Smokeless tobacco: Never  Scientific laboratory technician Use: Never used  Substance Use Topics   Alcohol use: No    Alcohol/week: 0.0 standard drinks of alcohol   Drug use: No    Review of symptoms:  Constitutional:  Negative for unexplained weight loss, night sweats, fever, chills ENT:  Negative for nose bleeds, sinus pain, painful swallowing CV:  Negative for chest pain, shortness of breath, exercise intolerance, palpitations, loss of consciousness Resp:  Negative for cough, wheezing, shortness of breath GI:  Negative for nausea, vomiting, diarrhea, bloody stools GU:  Positives noted in HPI; otherwise negative for gross hematuria,  dysuria***, urinary incontinence Neuro:  Negative for seizures, poor balance, limb weakness, slurred speech Psych:  Negative for lack of energy, depression, anxiety Endocrine:  Negative for polydipsia, polyuria, symptoms of hypoglycemia (dizziness, hunger, sweating) Hematologic:  Negative for anemia, purpura, petechia, prolonged or excessive bleeding, use of anticoagulants***   Physical Exam: BP (!) 163/84   Pulse 74   LMP 02/05/1991 (Approximate)   Constitutional:  Alert and oriented, No acute distress. HEENT: NCAT, moist mucus membranes.  Trachea midline, no masses. Cardiovascular: Regular rate and rhythm without murmur, rub, or gallops No clubbing, cyanosis, or edema***. Respiratory: Normal respiratory effort, clear to auscultation bilaterally GI: Abdomen is soft, nontender, nondistended, no abdominal masses GU: *** BACK:  Non-tender to palpation.  No CVAT Lymph: No cervical or inguinal lymphadenopathy. Skin: No obvious rashes, warm, dry, intact Neurologic: Alert and oriented, Cranial nerves grossly intact, no focal deficits, moving all 4 extremities***. Psychiatric: Appropriate. Normal mood and affect.  Laboratory Data: Results for orders placed or performed in visit on 07/15/22 (from the past 24 hour(s))  BLADDER SCAN AMB NON-IMAGING   Collection Time: 07/15/22  2:06 PM  Result Value Ref Range   Scan Result 194     Lab Results  Component Value Date   WBC 4.9 12/03/2018   HGB 14.7 12/03/2018   HCT  43.2 12/03/2018   MCV 91 12/03/2018   PLT 179 12/03/2018    Lab Results  Component Value Date   CREATININE 1.07 (H) 12/03/2018     Urinalysis    Component Value Date/Time   APPEARANCEUR Clear 12/03/2018 1607   GLUCOSEU Negative 12/03/2018 1607   BILIRUBINUR Negative 12/03/2018 1607   PROTEINUR Negative 12/03/2018 1607   NITRITE Negative 12/03/2018 1607   LEUKOCYTESUR Trace (A) 12/03/2018 1607    Lab Results  Component Value Date   LABMICR See below: 12/03/2018    WBCUA 0-5 12/03/2018   RBCUA 0-2 12/03/2018   LABEPIT 0-10 12/03/2018   BACTERIA Few (A) 12/03/2018    Pertinent Imaging: No results found for this or any previous visit.  No results found for this or any previous visit.     [place], Berneice Heinrich, PA-C [facility] [place]

## 2022-07-15 NOTE — Progress Notes (Signed)
scaspost void residual 194 Urine was sent for MDX culture  Confirmation# [id number] Tracking# [id number]

## 2022-07-16 LAB — BASIC METABOLIC PANEL
BUN/Creatinine Ratio: 15 (ref 12–28)
BUN: 15 mg/dL (ref 8–27)
CO2: 21 mmol/L (ref 20–29)
Calcium: 9.3 mg/dL (ref 8.7–10.3)
Chloride: 105 mmol/L (ref 96–106)
Creatinine, Ser: 1.02 mg/dL — ABNORMAL HIGH (ref 0.57–1.00)
Glucose: 75 mg/dL (ref 70–99)
Potassium: 3.7 mmol/L (ref 3.5–5.2)
Sodium: 143 mmol/L (ref 134–144)
eGFR: 62 mL/min/{1.73_m2} (ref >59)

## 2022-07-17 ENCOUNTER — Telehealth: Payer: Self-pay

## 2022-07-17 ENCOUNTER — Other Ambulatory Visit: Payer: Self-pay | Admitting: Physician Assistant

## 2022-07-17 NOTE — Telephone Encounter (Signed)
Patient aware and will hold for 2 days prior to cysto procedure.

## 2022-07-17 NOTE — Telephone Encounter (Signed)
Patient wants to know how long she needs to hold her pyridium prior to her CT and when can she resume?  CT scheduled for Monday 7/31.  Please advise.

## 2022-07-18 ENCOUNTER — Ambulatory Visit: Payer: 59 | Admitting: Internal Medicine

## 2022-07-21 ENCOUNTER — Telehealth: Payer: Self-pay

## 2022-07-21 ENCOUNTER — Ambulatory Visit (HOSPITAL_COMMUNITY)
Admission: RE | Admit: 2022-07-21 | Discharge: 2022-07-21 | Disposition: A | Discharge status: Home / Self Care | Payer: 59 | Source: Ambulatory Visit | Attending: Physician Assistant | Admitting: Physician Assistant

## 2022-07-21 DIAGNOSIS — R310 Gross hematuria: Secondary | ICD-10-CM | POA: Insufficient documentation

## 2022-07-21 MED ORDER — IOHEXOL 300 MG/ML  SOLN
100.0000 mL | Freq: Once | INTRAMUSCULAR | Status: AC | PRN
  Administered 2022-07-21: 100 mL via INTRAVENOUS

## 2022-07-21 NOTE — Telephone Encounter (Signed)
Patients husband called advising she was in pain and needed a refill on below medication.  Medication: phenazopyridine (PYRIDIUM) 100 MG tablet   Pharmacy: [facility] [place], [place] - [address] AT [place]

## 2022-07-22 ENCOUNTER — Telehealth: Payer: Self-pay

## 2022-07-22 ENCOUNTER — Other Ambulatory Visit: Payer: Self-pay

## 2022-07-22 DIAGNOSIS — C801 Malignant (primary) neoplasm, unspecified: Secondary | ICD-10-CM

## 2022-07-22 HISTORY — DX: Malignant (primary) neoplasm, unspecified: C80.1

## 2022-07-22 MED ORDER — PHENAZOPYRIDINE HCL 100 MG PO TABS: 100.0000 mg | ORAL_TABLET | Freq: Three times a day (TID) | ORAL | 0 refills | Status: DC | PRN

## 2022-07-22 NOTE — Progress Notes (Signed)
Rx refill only 

## 2022-07-22 NOTE — Telephone Encounter (Signed)
I called patient to clarify her symptoms, she is having bladder pain.  Verbal from Dr. Felipa Eth to refill Pyridium rx.  Refill sent to pharmacy.

## 2022-07-22 NOTE — Telephone Encounter (Signed)
Patient states she is having severe abdominal pain that is wrapping around her back.  She is asking what can be done until she gets her CT results, [name] is not in office until tomorrow afternoon.  Please advise.

## 2022-07-24 ENCOUNTER — Ambulatory Visit (INDEPENDENT_AMBULATORY_CARE_PROVIDER_SITE_OTHER): Payer: 59 | Admitting: Neurology

## 2022-07-24 ENCOUNTER — Encounter: Payer: Self-pay | Admitting: Neurology

## 2022-07-24 VITALS — BP 149/87 | HR 77 | Ht 66.0 in | Wt 187.0 lb

## 2022-07-24 DIAGNOSIS — R202 Paresthesia of skin: Secondary | ICD-10-CM

## 2022-07-24 DIAGNOSIS — R200 Anesthesia of skin: Secondary | ICD-10-CM

## 2022-07-24 DIAGNOSIS — R292 Abnormal reflex: Secondary | ICD-10-CM

## 2022-07-24 DIAGNOSIS — R269 Unspecified abnormalities of gait and mobility: Secondary | ICD-10-CM

## 2022-07-24 DIAGNOSIS — R39198 Other difficulties with micturition: Secondary | ICD-10-CM

## 2022-07-24 DIAGNOSIS — R29898 Other symptoms and signs involving the musculoskeletal system: Secondary | ICD-10-CM

## 2022-07-24 NOTE — Progress Notes (Signed)
[facility]  PATIENT: [name] DOB: January 25, 1961  REFERRING DOCTOR OR PCP: Delman Cheadle, PA-C SOURCE: Patient, notes from primary care, imaging and laboratory reports  _________________________________   HISTORICAL  CHIEF COMPLAINT:  Chief Complaint  Patient presents with   New Patient (Initial Visit)    Rm 1, alone. Pt referred for eval for numbness in hands and feet and decreased grip strength. Pt reports stumbling at times when walking.     HISTORY OF PRESENT ILLNESS:  I had the pleasure seeing your patient, [name], at [facility] for neurologic consultation regarding her numbness, hyperreflexia, and reduced grip strength.  She is a 61 year old woman who has had numbness in her hands and feet since February 2023.   Of note, she had a UTI at the time with LBP and flank pain.  The numbness started a few days after onset of urinary symptoms.  She was placed on antibiotics and after seeing urology it was discontinued  and started phenazopyridine.       UA/Cx was fine 07/15/2022 but symptoms have persisted.  When numbness started in February the hands were first affected.  She feels like their is sand on them and they are sticky like with a beach trip.   She also noted dropping items.   Shortly after the hand symptoms started  she began to note paresthesias in her legs like they were asleep.     Hand symptoms are more constant though feet symptoms fluctuate and sometimes are not present.    Her balance is usually ok.   She stumbles some but no falls.   She can walk far but feels gait si slower due to reduced balance.   Strength seems fine though she drops items out of her hands.    Her handwriting is sloppier.       Vision is unchanged.     Cognition is unchanged.   She generally sleeps well at night but this is worse if more pain.    She has urinary urgency and hesitancy since February and now wears pads due to incontinence.  She feels  she does not empty.    She has seen urology and Bladder scan showed increased volune according to the patient.  More recent CT scan showed normal kidneys but bladder wall was thick and hyper-enhanced (nonspecific cystitis)  In general symptoms came on over a couple days and have remained with mild worsenig.     She has seen a chiropractor for her back pain with mild benefit for neck and back pain. But other symptoms not improved.       She denies trauma.  B12 was low in May (152 with > 230 normal)and she has had B12 shots.     REVIEW OF SYSTEMS: Constitutional: No fevers, chills, sweats, or change in appetite Eyes: No visual changes, double vision, eye pain Ear, nose and throat: No hearing loss, ear pain, nasal congestion, sore throat Cardiovascular: No chest pain, palpitations Respiratory:  No shortness of breath at rest or with exertion.   No wheezes GastrointestinaI: No nausea, vomiting, diarrhea, abdominal pain, fecal incontinence Genitourinary:  No dysuria, urinary retention or frequency.  No nocturia. Musculoskeletal:  No neck pain, back pain Integumentary: No rash, pruritus, skin lesions Neurological: as above Psychiatric: No depression at this time.  No anxiety Endocrine: No palpitations, diaphoresis, change in appetite, change in weigh or increased thirst Hematologic/Lymphatic:  No anemia, purpura, petechiae. Allergic/Immunologic: No itchy/runny eyes, nasal congestion,  recent allergic reactions, rashes  ALLERGIES: Allergies  Allergen Reactions   Augmentin [Amoxicillin-Pot Clavulanate] Diarrhea and Nausea And Vomiting    Pt decreased dose to QD HS and tolerated well    HOME MEDICATIONS:  Current Outpatient Medications:    albuterol (VENTOLIN HFA) 108 (90 Base) MCG/ACT inhaler, Inhale into the lungs every 6 (six) hours as needed for wheezing or shortness of breath., Disp: , Rfl:    hydrochlorothiazide (HYDRODIURIL) 25 MG tablet, Take 25-50 mg by mouth daily., Disp: , Rfl:     ibuprofen (ADVIL) 200 MG tablet, Take 200 mg by mouth every 6 (six) hours as needed., Disp: , Rfl:    lisinopril (ZESTRIL) 20 MG tablet, Take 20 mg by mouth daily., Disp: , Rfl:    phenazopyridine (PYRIDIUM) 100 MG tablet, Take 1 tablet (100 mg total) by mouth 3 (three) times daily as needed for pain., Disp: 10 tablet, Rfl: 0  PAST MEDICAL HISTORY: Past Medical History:  Diagnosis Date   Asthma    COPD (chronic obstructive pulmonary disease) (HCC)    GERD (gastroesophageal reflux disease)    Hyperlipidemia    Hypertension    IFG (impaired fasting glucose)    Obesity    Peripheral vertigo    Thyroid disease     PAST SURGICAL HISTORY: Past Surgical History:  Procedure Laterality Date   ABDOMINAL HYSTERECTOMY      FAMILY HISTORY: Family History  Problem Relation Age of Onset   Cancer Mother        colon   Stroke Mother    Cancer Father        brain   Heart disease Father    Heart disease Maternal Grandmother    Cancer Maternal Grandfather        colon    SOCIAL HISTORY:  Social History   Socioeconomic History   Marital status: Married    Spouse name: Christia Reading   Number of children: 1   Years of education: Not on file   Highest education level: Bachelor's degree (e.g., BA, AB, BS)  Occupational History   Not on file  Tobacco Use   Smoking status: Former   Smokeless tobacco: Never  Vaping Use   Vaping Use: Never used  Substance and Sexual Activity   Alcohol use: No    Alcohol/week: 0.0 standard drinks of alcohol   Drug use: No   Sexual activity: Yes  Other Topics Concern   Not on file  Social History Narrative   Lives with husband   R handed   Caffeine: 2 C of coffee a day   Social Determinants of Radio broadcast assistant Strain: Not on file  Food Insecurity: Not on file  Transportation Needs: Not on file  Physical Activity: Not on file  Stress: Not on file  Social Connections: Not on file  Intimate Partner Violence: Not on file      PHYSICAL EXAM  Vitals:   07/24/22 1010  BP: (!) 149/87  Pulse: 77  Weight: 187 lb (84.8 kg)  Height: '5\' 6"'$  (1.676 m)    Body mass index is 30.18 kg/m.   General: The patient is well-developed and well-nourished and in no acute distress  HEENT:  Head is [place]/AT.  Sclera are anicteric.  Funduscopic exam shows normal optic discs and retinal vessels.  Neck: No carotid bruits are noted.  The neck is nontender.  Cardiovascular: The heart has a regular rate and rhythm with a normal S1 and S2. There were no murmurs, gallops or  rubs.    Skin: Extremities are without rash or  edema.  Musculoskeletal:  Back is nontender  Neurologic Exam  Mental status: The patient is alert and oriented x 3 at the time of the examination. The patient has apparent normal recent and remote memory, with an apparently normal attention span and concentration ability.   Speech is normal.  Cranial nerves: Extraocular movements are full. Pupils are equal, round, and reactive to light and accomodation.  Facial strength and sensation was normal.  Trapezius and sternocleidomastoid strength is normal. No dysarthria is noted.  The tongue is midline, and the patient has symmetric elevation of the soft palate. No obvious hearing deficits are noted.  Motor:  Muscle bulk is normal.   Tone is normal.  Strength was 4-4+/5 in the right triceps, 4+ biceps, 4/5 intrinsic hand muscles.  On the left, strength was 4+/5 in the triceps, 5/5 in the biceps and 4+/5 in the intrinsic hand muscles.  Strength is  5 / 5 in the legs.  Sensory: Sensory testing is intact to pinprick, soft touch and vibration sensation in the arms.  In the legs, she reported reduced and symmetric sensation to pinprick and temperature in the entire leg.  Vibration sensation was normal.  She had normal sensation to temperature in the entire back..  Coordination: Cerebellar testing reveals good finger-nose-finger and heel-to-shin bilaterally.  Gait and  station: Station is normal.   Gait is mildly wide with a mildly reduced stride. Tandem gait is wide.. Romberg is borderline.   Reflexes: Deep tendon reflexes are 2 at the biceps, 3 at the triceps, 3+ at the knees (crossed adductors), one at the knees and no clonus..   Plantar responses are extensor right and flexor left.   Hoffman sign present on right, equiv on left.   Marland Kitchen    DIAGNOSTIC DATA (LABS, IMAGING, TESTING) - I reviewed patient records, labs, notes, testing and imaging myself where available.  Lab Results  Component Value Date   WBC 4.9 12/03/2018   HGB 14.7 12/03/2018   HCT 43.2 12/03/2018   MCV 91 12/03/2018   PLT 179 12/03/2018      Component Value Date/Time   NA 143 07/15/2022 1605   K 3.7 07/15/2022 1605   CL 105 07/15/2022 1605   CO2 21 07/15/2022 1605   GLUCOSE 75 07/15/2022 1605   BUN 15 07/15/2022 1605   CREATININE 1.02 (H) 07/15/2022 1605   CALCIUM 9.3 07/15/2022 1605   PROT 6.6 12/03/2018 1610   ALBUMIN 4.2 12/03/2018 1610   AST 33 12/03/2018 1610   ALT 41 (H) 12/03/2018 1610   ALKPHOS 104 12/03/2018 1610   BILITOT 0.4 12/03/2018 1610   GFRNONAA 57 (L) 12/03/2018 1610   GFRAA 66 12/03/2018 1610   Lab Results  Component Value Date   CHOL 178 12/03/2018   HDL 48 12/03/2018   LDLCALC 76 12/03/2018   TRIG 271 (H) 12/03/2018   CHOLHDL 3.5 06/12/2017   No results found for: "HGBA1C" No results found for: "VITAMINB12" Lab Results  Component Value Date   TSH 2.910 12/03/2018       ASSESSMENT AND PLAN  Gait disturbance - Plan: MR BRAIN W WO CONTRAST, MR CERVICAL SPINE W WO CONTRAST  Numbness and tingling of hand - Plan: MR BRAIN W WO CONTRAST, MR CERVICAL SPINE W WO CONTRAST  Right arm weakness - Plan: MR BRAIN W WO CONTRAST, MR CERVICAL SPINE W WO CONTRAST  Urinary dysfunction - Plan: MR [place], MR  CERVICAL SPINE W WO CONTRAST  Hyperreflexia - Plan: MR BRAIN W WO CONTRAST, MR CERVICAL SPINE W WO CONTRAST  In summary, Ms. Meece  is a 61 year old woman with gait disturbance, numbness, right arm weakness, hyperreflexia and urinary dysfunction.  Due to her symptoms, we need to be most concerned about a cervical myelopathy as that could be a unifying diagnosis and we will check MRI of the cervical spine to further evaluate.  I will also check MRI of the brain to assess for demyelination.  Vitamin B12 was low and could be playing some role though she has had no improvement despite being on B12 shots.  She is advised to continue the B12 shots.  Based on the results of the imaging study, reevaluation may be needed if there is potential evidence of demyelination and if there is an extrinsic myelopathy she may need referral to neurosurgery.  She is advised to stay active and to avoid heavy lifting until we have done the imaging studies..  Thank you for asking me to see Ms. Harle Battiest.  She will return to see me in 4 weeks or sooner if there are new or worsening neurologic symptoms.  Please let me know if I can be of further assistance with her or other patients in the future.  Aloha Bartok A. Felecia Shelling, MD, [facility] 01/28/8340, 96:22 AM Certified in Neurology, Clinical Neurophysiology, Sleep Medicine and Neuroimaging  [facility] [address] [place], [place] [address] [phone]

## 2022-07-25 DIAGNOSIS — N3010 Interstitial cystitis (chronic) without hematuria: Secondary | ICD-10-CM | POA: Insufficient documentation

## 2022-07-25 LAB — URINALYSIS, ROUTINE W REFLEX MICROSCOPIC
Bilirubin, UA: NEGATIVE
Glucose, UA: NEGATIVE
Ketones, UA: NEGATIVE
Nitrite, UA: NEGATIVE
Specific Gravity, UA: 1.02 (ref 1.005–1.030)
Urobilinogen, Ur: 0.2 mg/dL (ref 0.2–1.0)
pH, UA: 6 (ref 5.0–7.5)

## 2022-07-25 LAB — MICROSCOPIC EXAMINATION: Renal Epithel, UA: NONE SEEN /hpf

## 2022-07-25 NOTE — Assessment & Plan Note (Addendum)
[name] is a very nice female here for evaluation of sudden onset cystitis symptoms that have been persistent since March 2023 despite multiple rounds of different antibiotics.   She has been following with PCP and Urology in [place] and was referred here after she had previously grown out 20K colonies of drug resistant Comamonas Tesosteronii in May 2023 that was resistant to bactrim and quinolones. She then she only grown out E coli (pan sensitive) one time in July for which she was prescribed Augmentin and had trouble tolerating due to GI symptoms but felt most improved on out of all antibiotic courses. What has really helped symptoms the most has been Pyridium of which she is on now. She has had persistent large blood and abnormal urinalysis with repeated collections.   We had a good discusison today - CT scan definitely shows inflammatory changes to the bladder wall that are not specific and has occult blood in urine. Upper GU structures are all normal appearing.   I don't feel that we need to target the Comamonas species since it has never been re-isolated over this time frame and she has not been on anything that would have reliably treated it. Could have been a skin contaminant from collection of sample. While she has received tx with Doxycycline for this in the past, doxycycline does not concentrate in the urine and is not a reliable option for UTI treatment. If this were the true culprit here I would have expected her to get sicker over this time frame of it being untreated.   I think there must be an alternative diagnosis here outside of urinary tract infection.  Explained that she will probably have a difficult time differentiating UTI's from interstitial cystitis pain - If pyridium does not help or she has malaise/fevers/chills then it is reasonable to assume she has UTI and proceed with treatment. With this question of possible drug resistant organism involved in the past, would recommend to  provide urine sample with empiric treatment then adjust as needed. Her last urine sample submitted 7/26 was without any pathogen detected on PCR testing (not inclusive) but her UA actually looked benign aside from heavy RBCs and Large blood. Agree with stopping and watching off antibiotics - they are not helping.  Given she had pan sensitive e coli from more recent positive culture (July 12) would try Macrobid vs Trimethoprim-sulfa since she tolerated these better instead of Augmentin. Keflex is also reasonable. If she presents acutely ill and concern for pyelo or gets sicker on oral therapy then would refer to ER for initiation of carbapenem via IV for presumed resistant organism.   Curious to see what MRI of head/c-spine show if any explanation of unifying diagnosis.   All questions welcomed and answered today. She can return PRN and continue following up with Urology and PCP team.

## 2022-07-25 NOTE — Progress Notes (Signed)
Patient: [name]  DOB: [birth year] MRN: [id number] PCP: Jake Samples, PA-C  Referring Provider:    Patient Active Problem List   Diagnosis Date Noted   Pain due to interstitial cystitis 07/25/2022   Family history of colon cancer in mother 03/06/2021   History of cervical cancer 06/12/2017   Acid reflux 02/06/2016   Hypothyroidism 02/06/2016   Asthma 02/06/2016   Benign hypertension 02/06/2016   Peripheral vertigo 02/06/2016   COPD (chronic obstructive pulmonary disease) ([place]) 02/06/2016   IFG (impaired fasting glucose) 02/06/2016   Obesity 02/06/2016   Hyperlipidemia 02/06/2016     Subjective:  [name] is a 61 y.o. female with a history of urinary tract infections that started suddenly in February of this year. She has had episodes of burning, frequency, incontinence and hematuria.   Review of medical records reveals that at one point she grew out 100k of Comamonas Tesosteronii --> I think she was treated with Ciprofloxacin x 7d in March 2023 for this.   Urinary symptoms again in May 2024 - treated with Doxycycline x 10d with a 60mperiod of time where she was symptom free.  With return of symptoms at the end of May she was re-prescribed 10d supply of doxycycline.   July 7th urine grew out E coli --> treated with Augmentin that she was not able to tolerate d/t GI upset and only took once a day.  July 26 Resolve UTI MDX was assessed (which looks like a PCR based test for a limited panel of pathogens) this was negative and no resistance mechanisms were detected.   She at her follow up at the end of July had persistent urinary frequency, urgency, incontinence and nocturia. Wearing pads due to leaking. She also has some neurologic symptoms that have developed around the same time as the urinary symptoms. She has seen Dr. SNathaneil Canarywith [facility] and is scheduled for a MRI of the CSpine to investigate possible cervical myelopathy as well as a MRI  of the head to assess demyelinating disease as a potential unifying diagnosis here.  She has a history of cervical cancer in with partial hysterectomy and 12 weeks of radiation in 1992 - had surgical management at that point and her bladder nicked and worried that this was causing some of the current symptoms.    Has had multiple rounds of antibiotics since March of this year - pretty consistently actually. She does not feel like she has really had any improvement on these with exception of the last antibiotic - that one she feels like helped her symptom severity but it never fully resolved. Has also been using Phenazopyridine the last 2 months to help with the dysuria which out of everything seems to have been the most helpful.    ABX Course:  03/18/22 - Cipro x 7d  03/26/22 - Macrobid x 10d 04/10/22 - Bactrim x 7d 05/02/22 - Macrobid x 10d 05/14/22 - Doxycycline x 10d 06/27/22 - Doxycycline x 10d 07/04/22 - Augmentin x 10d (did not tolerate and only took QHS)  Her son has Leukemia - has had exposure to MRSA prior to the onset of all of these symptoms.   She was taken off all of her antibiotics by her neurologist recently as there is a concern that there may be a unifying neurologic consideration to explain her onset of urinary dysfunction, bilateral hand numbness (which started prior to cipro rx) and ataxic symptoms.    Review of Systems  Constitutional:  Negative for chills, fever and malaise/fatigue.  Genitourinary:  Positive for dysuria, frequency, hematuria and urgency. Negative for flank pain.     Past Medical History:  Diagnosis Date   Asthma    COPD (chronic obstructive pulmonary disease) (HCC)    GERD (gastroesophageal reflux disease)    Hyperlipidemia    Hypertension    IFG (impaired fasting glucose)    Obesity    Peripheral vertigo    Thyroid disease     Outpatient Medications Prior to Visit  Medication Sig Dispense Refill   albuterol (VENTOLIN HFA) 108 (90 Base)  MCG/ACT inhaler Inhale into the lungs every 6 (six) hours as needed for wheezing or shortness of breath.     ibuprofen (ADVIL) 200 MG tablet Take 200 mg by mouth every 6 (six) hours as needed.     lisinopril (ZESTRIL) 20 MG tablet Take 20 mg by mouth daily.     phenazopyridine (PYRIDIUM) 100 MG tablet Take 1 tablet (100 mg total) by mouth 3 (three) times daily as needed for pain. 10 tablet 0   hydrochlorothiazide (HYDRODIURIL) 25 MG tablet Take 25-50 mg by mouth daily. (Patient not taking: Reported on 07/28/2022)     No facility-administered medications prior to visit.     Allergies  Allergen Reactions   Augmentin [Amoxicillin-Pot Clavulanate] Diarrhea and Nausea And Vomiting    Pt decreased dose to QD HS and tolerated well    Social History   Tobacco Use   Smoking status: Former   Smokeless tobacco: Never  Scientific laboratory technician Use: Never used  Substance Use Topics   Alcohol use: No    Alcohol/week: 0.0 standard drinks of alcohol   Drug use: No    Family History  Problem Relation Age of Onset   Cancer Mother        colon   Stroke Mother    Cancer Father        brain   Heart disease Father    Heart disease Maternal Grandmother    Cancer Maternal Grandfather        colon    Objective:   Vitals:   07/28/22 1357  BP: (!) 191/92  Pulse: 73  Resp: 16  Temp: 97.7 F (36.5 C)  TempSrc: Oral  SpO2: 99%  Weight: 194 lb (88 kg)   Body mass index is 31.31 kg/m.  Physical Exam Vitals reviewed.  Constitutional:      Appearance: Normal appearance. She is not ill-appearing.  HENT:     Mouth/Throat:     Mouth: Mucous membranes are moist.     Pharynx: Oropharynx is clear.  Eyes:     General: No scleral icterus. Pulmonary:     Effort: Pulmonary effort is normal.  Neurological:     Mental Status: She is oriented to person, place, and time.  Psychiatric:        Mood and Affect: Mood normal.        Thought Content: Thought content normal.     Lab Results: Lab  Results  Component Value Date   WBC 4.9 12/03/2018   HGB 14.7 12/03/2018   HCT 43.2 12/03/2018   MCV 91 12/03/2018   PLT 179 12/03/2018    Lab Results  Component Value Date   CREATININE 1.02 (H) 07/15/2022   BUN 15 07/15/2022   NA 143 07/15/2022   K 3.7 07/15/2022   CL 105 07/15/2022   CO2 21 07/15/2022    Lab Results  Component Value Date  ALT 41 (H) 12/03/2018   AST 33 12/03/2018   ALKPHOS 104 12/03/2018   BILITOT 0.4 12/03/2018     Assessment & Plan:   Problem List Items Addressed This Visit       Unprioritized   Pain due to interstitial cystitis    Kynlea is a very nice female here for evaluation of sudden onset cystitis symptoms that have been persistent since March 2023 despite multiple rounds of different antibiotics.   She has been following with PCP and Urology in [place] and was referred here after she had previously grown out 20K colonies of drug resistant Comamonas Tesosteronii in May 2023 that was resistant to bactrim and quinolones. She then she only grown out E coli (pan sensitive) one time in July for which she was prescribed Augmentin and had trouble tolerating due to GI symptoms but felt most improved on out of all antibiotic courses. What has really helped symptoms the most has been Pyridium of which she is on now. She has had persistent large blood and abnormal urinalysis with repeated collections.   We had a good discusison today - CT scan definitely shows inflammatory changes to the bladder wall that are not specific and has occult blood in urine. Upper GU structures are all normal appearing.   I don't feel that we need to target the Comamonas species since it has never been re-isolated over this time frame and she has not been on anything that would have reliably treated it. Could have been a skin contaminant from collection of sample. While she has received tx with Doxycycline for this in the past, doxycycline does not concentrate in the urine and is  not a reliable option for UTI treatment. If this were the true culprit here I would have expected her to get sicker over this time frame of it being untreated.   I think there must be an alternative diagnosis here outside of urinary tract infection.  Explained that she will probably have a difficult time differentiating UTI's from interstitial cystitis pain - If pyridium does not help or she has malaise/fevers/chills then it is reasonable to assume she has UTI and proceed with treatment. With this question of possible drug resistant organism involved in the past, would recommend to provide urine sample with empiric treatment then adjust as needed. Her last urine sample submitted 7/26 was without any pathogen detected on PCR testing (not inclusive) but her UA actually looked benign aside from heavy RBCs and Large blood. Agree with stopping and watching off antibiotics - they are not helping.  Given she had pan sensitive e coli from more recent positive culture (July 12) would try Macrobid vs Trimethoprim-sulfa since she tolerated these better instead of Augmentin. Keflex is also reasonable. If she presents acutely ill and concern for pyelo or gets sicker on oral therapy then would refer to ER for initiation of carbapenem via IV for presumed resistant organism.   Curious to see what MRI of head/c-spine show if any explanation of unifying diagnosis.   All questions welcomed and answered today. She can return PRN and continue following up with Urology and PCP team.         Janene Madeira, MSN, NP-C [place] [facility] Pager: [phone] Office: [phone]  07/28/22  4:14 PM

## 2022-07-28 ENCOUNTER — Telehealth: Payer: Self-pay

## 2022-07-28 ENCOUNTER — Ambulatory Visit (INDEPENDENT_AMBULATORY_CARE_PROVIDER_SITE_OTHER): Payer: 59 | Admitting: Infectious Diseases

## 2022-07-28 ENCOUNTER — Encounter: Payer: Self-pay | Admitting: Infectious Diseases

## 2022-07-28 ENCOUNTER — Telehealth: Payer: Self-pay | Admitting: Neurology

## 2022-07-28 ENCOUNTER — Other Ambulatory Visit: Payer: Self-pay

## 2022-07-28 DIAGNOSIS — N3010 Interstitial cystitis (chronic) without hematuria: Secondary | ICD-10-CM | POA: Diagnosis not present

## 2022-07-28 NOTE — Telephone Encounter (Signed)
Patient left me a message asking for MRI update

## 2022-07-28 NOTE — Telephone Encounter (Signed)
Patient called requesting a refill of pyridium.  She had seen infection control and they advised her to continue pyridium for the time being.  I informed her to reach out to that provider and have them send in an rx since the provider here only felt the 10 tablets to be appropriate at the time.  Patient agreed and nothing further needed.

## 2022-07-28 NOTE — Patient Instructions (Signed)
So nice to meet you.   I think your underlying problem may be a non-infectious cause. You should have had some improvement over the last nearly 5 months with something you have been on. I do not think that one resistant bacteria that grew very lightly back in February is contributing to anything currently. You would have gotten sicker over this time frame if that was the case.   Would continue to use the pyridium for the pain/discomfort as your urologist recommends.  Please reach out to your neurologist to see about the scheduling for the scans ordered.   Follow up can be as needed here - I will send recommendations to your urologist local to you as well.

## 2022-07-29 ENCOUNTER — Telehealth: Payer: Self-pay | Admitting: Neurology

## 2022-07-29 NOTE — Telephone Encounter (Signed)
/  90 mins MRI cervical w/wo & MRI brain w/wo Dr. Felecia Shelling [employer] auth: [id number] exp. 07/28/22-10/28/22 scheduled at [facility] 07/30/22 at 3pm

## 2022-07-30 ENCOUNTER — Ambulatory Visit: Payer: 59

## 2022-07-30 DIAGNOSIS — R269 Unspecified abnormalities of gait and mobility: Secondary | ICD-10-CM | POA: Diagnosis not present

## 2022-07-30 DIAGNOSIS — R39198 Other difficulties with micturition: Secondary | ICD-10-CM

## 2022-07-30 DIAGNOSIS — R200 Anesthesia of skin: Secondary | ICD-10-CM | POA: Diagnosis not present

## 2022-07-30 DIAGNOSIS — R202 Paresthesia of skin: Secondary | ICD-10-CM

## 2022-07-30 DIAGNOSIS — R29898 Other symptoms and signs involving the musculoskeletal system: Secondary | ICD-10-CM | POA: Diagnosis not present

## 2022-07-30 DIAGNOSIS — R292 Abnormal reflex: Secondary | ICD-10-CM

## 2022-07-30 MED ORDER — GADOBENATE DIMEGLUMINE 529 MG/ML IV SOLN
15.0000 mL | Freq: Once | INTRAVENOUS | Status: AC | PRN
  Administered 2022-07-30: 15 mL via INTRAVENOUS

## 2022-07-31 ENCOUNTER — Telehealth: Payer: Self-pay | Admitting: Neurology

## 2022-07-31 DIAGNOSIS — G959 Disease of spinal cord, unspecified: Secondary | ICD-10-CM

## 2022-07-31 DIAGNOSIS — M4802 Spinal stenosis, cervical region: Secondary | ICD-10-CM

## 2022-07-31 NOTE — Telephone Encounter (Signed)
I discussed the results of the MRI she had yesterday.  The cervical spine shows moderate or severe spinal stenosis at C4-C5, C5-C6 and C6-C7 and there are definite myelopathic changes within the spinal cord at C4-C5 and subtle changes at C6-C7.  I went over the significance of the finding and believe many of her symptoms could be explained by a cervical myelopathy..  I am going to have her see neurosurgery.  The MRI of the brain showed white matter changes most consistent with moderate chronic microvascular ischemic change.  The pattern would not be typical for demyelination.

## 2022-08-04 ENCOUNTER — Telehealth: Payer: Self-pay | Admitting: Neurology

## 2022-08-04 NOTE — Telephone Encounter (Signed)
Sent to [facility] ph # [phone]

## 2022-08-18 ENCOUNTER — Telehealth: Payer: Self-pay

## 2022-08-18 ENCOUNTER — Ambulatory Visit (INDEPENDENT_AMBULATORY_CARE_PROVIDER_SITE_OTHER): Payer: 59 | Admitting: Urology

## 2022-08-18 ENCOUNTER — Encounter: Payer: Self-pay | Admitting: Urology

## 2022-08-18 VITALS — BP 157/84 | HR 70

## 2022-08-18 DIAGNOSIS — R310 Gross hematuria: Secondary | ICD-10-CM

## 2022-08-18 DIAGNOSIS — R350 Frequency of micturition: Secondary | ICD-10-CM | POA: Diagnosis not present

## 2022-08-18 LAB — URINALYSIS, ROUTINE W REFLEX MICROSCOPIC
Bilirubin, UA: NEGATIVE
Glucose, UA: NEGATIVE
Ketones, UA: NEGATIVE
Nitrite, UA: NEGATIVE
Specific Gravity, UA: 1.02 (ref 1.005–1.030)
Urobilinogen, Ur: 1 mg/dL (ref 0.2–1.0)
pH, UA: 7 (ref 5.0–7.5)

## 2022-08-18 MED ORDER — CIPROFLOXACIN HCL 500 MG PO TABS: 500.0000 mg | ORAL_TABLET | Freq: Once | ORAL | Status: DC

## 2022-08-18 NOTE — H&P (View-Only) (Signed)
   08/18/22  CC: gross hematuria   HPI: Ms Barrie Folk is a 61yo here for cystoscopy for gross hematuria Blood pressure (!) 157/84, pulse 70, last menstrual period 02/05/1991. NED. A&Ox3.   No respiratory distress   Abd soft, NT, ND Normal external genitalia with patent urethral meatus  Cystoscopy Procedure Note  Patient identification was confirmed, informed consent was obtained, and patient was prepped using Betadine solution.  Lidocaine jelly was administered per urethral meatus.    Procedure: - Flexible cystoscope introduced, without any difficulty.   - Thorough search of the bladder revealed:    normal urethral meatus    4cm right lateral wall sessile tumor    no stones    no ulcers        no urethral polyps    no trabeculation  - Ureteral orifices were normal in position and appearance.  Post-Procedure: - Patient tolerated the procedure well  Assessment/ Plan: Urine for cytology We discussed the management of bladder tumors including transurethral resection. After discussing the treatment the patient elects to proceed with surgery. Risks/benefits/alternatives discussed   No follow-ups on file.  Nicolette Bang, MD

## 2022-08-18 NOTE — Telephone Encounter (Signed)
I spoke with Ms. [name]. We have discussed possible surgery dates and 09/08/2022 was agreed upon by all parties. Patient given information about surgery date, what to expect pre-operatively and post operatively.    We discussed that a pre-op nurse will be calling to set up the pre-op visit that will take place prior to surgery. Informed patient that our office will communicate any additional care to be provided after surgery.    Patients questions or concerns were discussed during our call. Advised to call our office should there be any additional information, questions or concerns that arise. Patient verbalized understanding.     Patient reports she is becoming insured under [place] and will bring insurance card to office once she receives her card.

## 2022-08-18 NOTE — Progress Notes (Unsigned)
   08/18/22  CC: gross hematuria   HPI: Ms Barrie Folk is a 61yo here for cystoscopy for gross hematuria Blood pressure (!) 157/84, pulse 70, last menstrual period 02/05/1991. NED. A&Ox3.   No respiratory distress   Abd soft, NT, ND Normal external genitalia with patent urethral meatus  Cystoscopy Procedure Note  Patient identification was confirmed, informed consent was obtained, and patient was prepped using Betadine solution.  Lidocaine jelly was administered per urethral meatus.    Procedure: - Flexible cystoscope introduced, without any difficulty.   - Thorough search of the bladder revealed:    normal urethral meatus    4cm right lateral wall sessile tumor    no stones    no ulcers        no urethral polyps    no trabeculation  - Ureteral orifices were normal in position and appearance.  Post-Procedure: - Patient tolerated the procedure well  Assessment/ Plan: Urine for cytology We discussed the management of bladder tumors including transurethral resection. After discussing the treatment the patient elects to proceed with surgery. Risks/benefits/alternatives discussed   No follow-ups on file.  Nicolette Bang, MD

## 2022-08-19 LAB — CYTOLOGY, URINE

## 2022-08-20 ENCOUNTER — Telehealth: Payer: Self-pay

## 2022-08-20 NOTE — Telephone Encounter (Signed)
Request for surgery date to be moved up sent to [place] scheduler

## 2022-08-21 ENCOUNTER — Encounter: Payer: Self-pay | Admitting: Urology

## 2022-08-21 ENCOUNTER — Encounter (HOSPITAL_COMMUNITY): Payer: Self-pay

## 2022-08-21 ENCOUNTER — Encounter (HOSPITAL_COMMUNITY)
Admission: RE | Admit: 2022-08-21 | Discharge: 2022-08-21 | Disposition: A | Discharge status: Home / Self Care | Payer: 59 | Source: Ambulatory Visit | Attending: Urology | Admitting: Urology

## 2022-08-21 DIAGNOSIS — Z01818 Encounter for other preprocedural examination: Secondary | ICD-10-CM | POA: Insufficient documentation

## 2022-08-21 NOTE — Patient Instructions (Signed)
[name]  08/21/2022     '@PREFPERIOPPHARMACY'$ @   Your procedure is scheduled on  08/26/2022.   Report to [facility] at  1130  A.M.   Call this number if you have problems the morning of surgery:  [phone]   Remember:  Do not eat or drink after midnight.      Take these medicines the morning of surgery with A SIP OF WATER                                                                    None     Do not wear jewelry, make-up or nail polish.  Do not wear lotions, powders, or perfumes, or deodorant.  Do not shave 48 hours prior to surgery.  Men may shave face and neck.  Do not bring valuables to the hospital.  [facility] is not responsible for any belongings or valuables.  Contacts, dentures or bridgework may not be worn into surgery.  Leave your suitcase in the car.  After surgery it may be brought to your room.  For patients admitted to the hospital, discharge time will be determined by your treatment team.  Patients discharged the day of surgery will not be allowed to drive home and must have someone with them for 24 hours.    Special instructions:   DO  NOT smoke tobacco for 24 hours before your procedure.  Please read over the following fact sheets that you were given. Coughing and Deep Breathing, Surgical Site Infection Prevention, Anesthesia Post-op Instructions, and Care and Recovery After Surgery      Transurethral Resection of Bladder Tumor, Care After The following information offers guidance on how to care for yourself after your procedure. Your health care provider may also give you more specific instructions. If you have problems or questions, contact your health care provider. What can I expect after the procedure? After the procedure, it is common to have: A small amount of blood or small blood clots in your urine for up to 2 weeks. Soreness or mild pain from your catheter. After your catheter is removed, you may have mild soreness,  especially when urinating. A need to urinate often. Pain in your lower abdomen. Follow these instructions at home: Medicines  Take over-the-counter and prescription medicines only as told by your health care provider. If you were prescribed an antibiotic medicine, take it as told by your health care provider. Do not stop taking the antibiotic even if you start to feel better. Ask your health care provider if the medicine prescribed to you: Requires you to avoid driving or using machinery. Can cause constipation. You may need to take these actions to prevent or treat constipation: Drink enough fluid to keep your urine pale yellow. Take over-the-counter or prescription medicines. Eat foods that are high in fiber, such as beans, whole grains, and fresh fruits and vegetables. Limit foods that are high in fat and processed sugars, such as fried or sweet foods. Activity  If you were given a sedative during the procedure, it can affect you for several hours. Do not drive or operate machinery until your health care provider says that it is safe. Rest as told by your health care  provider. Avoid sitting for a long time without moving. Get up to take short walks every 1-2 hours. This is important to improve blood flow and breathing. Ask for help if you feel weak or unsteady. Do not lift anything that is heavier than 10 lb (4.5 kg), or the limit that you are told, until your health care provider says that it is safe. Avoid intense physical activity for as long as told by your health care provider. Do not have sex until your health care provider approves. Return to your normal activities as told by your health care provider. Ask your health care provider what activities are safe for you. General instructions If you have a catheter, follow instructions from your health care provider about caring for your catheter and your drainage bag. Do not drink alcohol for as long as told by your health care  provider. This is especially important if you are taking prescription pain medicines. Do not use any products that contain nicotine or tobacco. These products include cigarettes, chewing tobacco, and vaping devices, such as e-cigarettes. If you need help quitting, ask your health care provider. Wear compression stockings as told by your health care provider. These stockings help to prevent blood clots and reduce swelling in your legs. Keep all follow-up visits. This is important. You will need to be followed closely with regular checks of your bladder and urethra (cystoscopies) to make sure that the cancer does not come back. Contact a health care provider if: You have blood in your urine for more than 2 weeks. You become constipated. Signs of constipation may include: Having fewer than three bowel movements in a week. Difficulty having a bowel movement. Stools that are dry, hard, or larger than normal. You have a urinary catheter in place, and you have: Spasms or pain. Problems with your catheter or your catheter is blocked. Your catheter has been taken out but you are unable to urinate. You have signs of infection, such as: Fever or chills. Cloudy or bad-smelling urine. Get help right away if: You have severe abdominal pain that gets worse or does not improve with medicine. You have a lot of large blood clots in your urine. You develop swelling or pain in your leg. You have difficulty breathing. These symptoms may be an emergency. Get help right away. Call 911. Do not wait to see if the symptoms will go away. Do not drive yourself to the hospital. Summary After your procedure, it is common to have a small amount of blood or small blood clots in your urine, soreness or mild pain from your catheter, and pain in your lower abdomen. Take over-the-counter and prescription medicines only as told by your health care provider. Rest as told by your health care provider. Follow your health care  provider's instructions about returning to normal activities. Ask what activities are safe for you. If you have a catheter, follow instructions from your health care provider about caring for your catheter and your drainage bag. This information is not intended to replace advice given to you by your health care provider. Make sure you discuss any questions you have with your health care provider. Document Revised: 12/13/2021 Document Reviewed: 12/13/2021 Elsevier Patient Education  [place] Anesthesia, Adult, Care After The following information offers guidance on how to care for yourself after your procedure. Your health care provider may also give you more specific instructions. If you have problems or questions, contact your health care provider. What can I expect after  the procedure? After the procedure, it is common for people to: Have pain or discomfort at the IV site. Have nausea or vomiting. Have a sore throat or hoarseness. Have trouble concentrating. Feel cold or chills. Feel weak, sleepy, or tired (fatigue). Have soreness and body aches. These can affect parts of the body that were not involved in surgery. Follow these instructions at home: For the time period you were told by your health care provider:  Rest. Do not participate in activities where you could fall or become injured. Do not drive or use machinery. Do not drink alcohol. Do not take sleeping pills or medicines that cause drowsiness. Do not make important decisions or sign legal documents. Do not take care of children on your own. General instructions Drink enough fluid to keep your urine pale yellow. If you have sleep apnea, surgery and certain medicines can increase your risk for breathing problems. Follow instructions from your health care provider about wearing your sleep device: Anytime you are sleeping, including during daytime naps. While taking prescription pain medicines, sleeping  medicines, or medicines that make you drowsy. Return to your normal activities as told by your health care provider. Ask your health care provider what activities are safe for you. Take over-the-counter and prescription medicines only as told by your health care provider. Do not use any products that contain nicotine or tobacco. These products include cigarettes, chewing tobacco, and vaping devices, such as e-cigarettes. These can delay incision healing after surgery. If you need help quitting, ask your health care provider. Contact a health care provider if: You have nausea or vomiting that does not get better with medicine. You vomit every time you eat or drink. You have pain that does not get better with medicine. You cannot urinate or have bloody urine. You develop a skin rash. You have a fever. Get help right away if: You have trouble breathing. You have chest pain. You vomit blood. These symptoms may be an emergency. Get help right away. Call 911. Do not wait to see if the symptoms will go away. Do not drive yourself to the hospital. Summary After the procedure, it is common to have a sore throat, hoarseness, nausea, vomiting, or to feel weak, sleepy, or fatigue. For the time period you were told by your health care provider, do not drive or use machinery. Get help right away if you have difficulty breathing, have chest pain, or vomit blood. These symptoms may be an emergency. This information is not intended to replace advice given to you by your health care provider. Make sure you discuss any questions you have with your health care provider. Document Revised: 03/07/2022 Document Reviewed: 03/07/2022 Elsevier Patient Education  [place].

## 2022-08-26 ENCOUNTER — Ambulatory Visit (HOSPITAL_COMMUNITY): Payer: 59 | Admitting: Certified Registered Nurse Anesthetist

## 2022-08-26 ENCOUNTER — Ambulatory Visit (HOSPITAL_COMMUNITY)
Admission: RE | Admit: 2022-08-26 | Discharge: 2022-08-26 | Disposition: A | Discharge status: Home / Self Care | Payer: 59 | Attending: Urology | Admitting: Urology

## 2022-08-26 ENCOUNTER — Encounter (HOSPITAL_COMMUNITY)
Admission: RE | Disposition: A | Discharge status: Home / Self Care | Payer: Self-pay | Source: Home / Self Care | Attending: Urology

## 2022-08-26 ENCOUNTER — Ambulatory Visit (HOSPITAL_COMMUNITY): Payer: 59

## 2022-08-26 ENCOUNTER — Ambulatory Visit (HOSPITAL_BASED_OUTPATIENT_CLINIC_OR_DEPARTMENT_OTHER): Payer: 59 | Admitting: Certified Registered Nurse Anesthetist

## 2022-08-26 DIAGNOSIS — C679 Malignant neoplasm of bladder, unspecified: Secondary | ICD-10-CM | POA: Diagnosis not present

## 2022-08-26 DIAGNOSIS — R310 Gross hematuria: Secondary | ICD-10-CM | POA: Insufficient documentation

## 2022-08-26 DIAGNOSIS — C672 Malignant neoplasm of lateral wall of bladder: Secondary | ICD-10-CM | POA: Insufficient documentation

## 2022-08-26 DIAGNOSIS — Z8541 Personal history of malignant neoplasm of cervix uteri: Secondary | ICD-10-CM | POA: Diagnosis not present

## 2022-08-26 DIAGNOSIS — D494 Neoplasm of unspecified behavior of bladder: Secondary | ICD-10-CM | POA: Diagnosis not present

## 2022-08-26 DIAGNOSIS — N3289 Other specified disorders of bladder: Secondary | ICD-10-CM | POA: Insufficient documentation

## 2022-08-26 DIAGNOSIS — E039 Hypothyroidism, unspecified: Secondary | ICD-10-CM

## 2022-08-26 DIAGNOSIS — I10 Essential (primary) hypertension: Secondary | ICD-10-CM | POA: Diagnosis not present

## 2022-08-26 DIAGNOSIS — Z87891 Personal history of nicotine dependence: Secondary | ICD-10-CM | POA: Diagnosis not present

## 2022-08-26 DIAGNOSIS — J449 Chronic obstructive pulmonary disease, unspecified: Secondary | ICD-10-CM

## 2022-08-26 HISTORY — PX: TRANSURETHRAL RESECTION OF BLADDER TUMOR: SHX2575

## 2022-08-26 HISTORY — PX: CYSTOSCOPY W/ RETROGRADES: SHX1426

## 2022-08-26 SURGERY — CYSTOSCOPY, WITH RETROGRADE PYELOGRAM
Anesthesia: General | Site: Bladder

## 2022-08-26 MED ORDER — PHENYLEPHRINE 80 MCG/ML (10ML) SYRINGE FOR IV PUSH (FOR BLOOD PRESSURE SUPPORT)
PREFILLED_SYRINGE | INTRAVENOUS | Status: DC | PRN
  Administered 2022-08-26: 80 ug via INTRAVENOUS

## 2022-08-26 MED ORDER — HYDROMORPHONE HCL 1 MG/ML IJ SOLN: 0.2500 mg | INTRAMUSCULAR | Status: DC | PRN

## 2022-08-26 MED ORDER — ORAL CARE MOUTH RINSE: 15.0000 mL | Freq: Once | OROMUCOSAL | Status: AC

## 2022-08-26 MED ORDER — ROCURONIUM BROMIDE 10 MG/ML (PF) SYRINGE
PREFILLED_SYRINGE | INTRAVENOUS | Status: DC | PRN
  Administered 2022-08-26: 50 mg via INTRAVENOUS

## 2022-08-26 MED ORDER — DIATRIZOATE MEGLUMINE 30 % UR SOLN
URETHRAL | Status: DC | PRN
  Administered 2022-08-26: 100 mL via URETHRAL

## 2022-08-26 MED ORDER — DEXAMETHASONE SODIUM PHOSPHATE 4 MG/ML IJ SOLN
INTRAMUSCULAR | Status: DC | PRN
  Administered 2022-08-26: 10 mg via INTRAVENOUS

## 2022-08-26 MED ORDER — DEXAMETHASONE SODIUM PHOSPHATE 10 MG/ML IJ SOLN
INTRAMUSCULAR | Status: AC
  Filled 2022-08-26: qty 1

## 2022-08-26 MED ORDER — PROPOFOL 10 MG/ML IV BOLUS
INTRAVENOUS | Status: AC
  Filled 2022-08-26: qty 20

## 2022-08-26 MED ORDER — MIDAZOLAM HCL 5 MG/5ML IJ SOLN
INTRAMUSCULAR | Status: DC | PRN
  Administered 2022-08-26: 2 mg via INTRAVENOUS

## 2022-08-26 MED ORDER — CEFAZOLIN SODIUM-DEXTROSE 2-4 GM/100ML-% IV SOLN
2.0000 g | INTRAVENOUS | Status: AC
  Administered 2022-08-26: 2 g via INTRAVENOUS
  Filled 2022-08-26: qty 100

## 2022-08-26 MED ORDER — SODIUM CHLORIDE 0.9 % IR SOLN
Status: DC | PRN
  Administered 2022-08-26 (×2): 3000 mL

## 2022-08-26 MED ORDER — SUGAMMADEX SODIUM 200 MG/2ML IV SOLN
INTRAVENOUS | Status: DC | PRN
  Administered 2022-08-26: 200 mg via INTRAVENOUS

## 2022-08-26 MED ORDER — LIDOCAINE HCL (PF) 2 % IJ SOLN
INTRAMUSCULAR | Status: AC
  Filled 2022-08-26: qty 5

## 2022-08-26 MED ORDER — FENTANYL CITRATE (PF) 100 MCG/2ML IJ SOLN
INTRAMUSCULAR | Status: AC
  Filled 2022-08-26: qty 2

## 2022-08-26 MED ORDER — ONDANSETRON HCL 4 MG/2ML IJ SOLN
INTRAMUSCULAR | Status: AC
  Filled 2022-08-26: qty 2

## 2022-08-26 MED ORDER — STERILE WATER FOR IRRIGATION IR SOLN
Status: DC | PRN
  Administered 2022-08-26: 500 mL

## 2022-08-26 MED ORDER — LIDOCAINE HCL (CARDIAC) PF 100 MG/5ML IV SOSY
PREFILLED_SYRINGE | INTRAVENOUS | Status: DC | PRN
  Administered 2022-08-26: 60 mg via INTRAVENOUS

## 2022-08-26 MED ORDER — ROCURONIUM BROMIDE 10 MG/ML (PF) SYRINGE
PREFILLED_SYRINGE | INTRAVENOUS | Status: AC
  Filled 2022-08-26: qty 10

## 2022-08-26 MED ORDER — LACTATED RINGERS IV SOLN: INTRAVENOUS | Status: DC

## 2022-08-26 MED ORDER — MEPERIDINE HCL 50 MG/ML IJ SOLN: 6.2500 mg | INTRAMUSCULAR | Status: DC | PRN

## 2022-08-26 MED ORDER — FENTANYL CITRATE (PF) 100 MCG/2ML IJ SOLN
INTRAMUSCULAR | Status: DC | PRN
Start: 2022-08-26 — End: 2022-08-26
  Administered 2022-08-26: 50 ug via INTRAVENOUS

## 2022-08-26 MED ORDER — OXYCODONE-ACETAMINOPHEN 5-325 MG PO TABS
ORAL_TABLET | ORAL | Status: AC
  Filled 2022-08-26: qty 1

## 2022-08-26 MED ORDER — MIDAZOLAM HCL 2 MG/2ML IJ SOLN
2.0000 mg | Freq: Once | INTRAMUSCULAR | Status: AC
  Administered 2022-08-26: 2 mg via INTRAVENOUS

## 2022-08-26 MED ORDER — MIDAZOLAM HCL 2 MG/2ML IJ SOLN
INTRAMUSCULAR | Status: AC
  Filled 2022-08-26: qty 2

## 2022-08-26 MED ORDER — DIATRIZOATE MEGLUMINE 30 % UR SOLN
URETHRAL | Status: AC
  Filled 2022-08-26: qty 100

## 2022-08-26 MED ORDER — ONDANSETRON HCL 4 MG/2ML IJ SOLN: 4.0000 mg | Freq: Once | INTRAMUSCULAR | Status: DC | PRN

## 2022-08-26 MED ORDER — ONDANSETRON HCL 4 MG/2ML IJ SOLN
INTRAMUSCULAR | Status: DC | PRN
  Administered 2022-08-26: 4 mg via INTRAVENOUS

## 2022-08-26 MED ORDER — PHENYLEPHRINE 80 MCG/ML (10ML) SYRINGE FOR IV PUSH (FOR BLOOD PRESSURE SUPPORT)
PREFILLED_SYRINGE | INTRAVENOUS | Status: AC
  Filled 2022-08-26: qty 10

## 2022-08-26 MED ORDER — PROPOFOL 10 MG/ML IV BOLUS
INTRAVENOUS | Status: DC | PRN
  Administered 2022-08-26: 200 mg via INTRAVENOUS

## 2022-08-26 MED ORDER — OXYCODONE-ACETAMINOPHEN 5-325 MG PO TABS
1.0000 | ORAL_TABLET | Freq: Once | ORAL | Status: AC
  Administered 2022-08-26: 1 via ORAL

## 2022-08-26 MED ORDER — CHLORHEXIDINE GLUCONATE 0.12 % MT SOLN
15.0000 mL | Freq: Once | OROMUCOSAL | Status: AC
  Administered 2022-08-26: 15 mL via OROMUCOSAL
  Filled 2022-08-26: qty 15

## 2022-08-26 MED ORDER — OXYCODONE-ACETAMINOPHEN 5-325 MG PO TABS: 1.0000 | ORAL_TABLET | ORAL | 0 refills | Status: DC | PRN

## 2022-08-26 SURGICAL SUPPLY — 26 items
BAG DRAIN URO TABLE W/ADPT NS (BAG) ×2 IMPLANT
BAG DRN 8 ADPR NS SKTRN CSTL (BAG) ×2
BAG DRN RND TRDRP ANRFLXCHMBR (UROLOGICAL SUPPLIES) ×2
BAG HAMPER (MISCELLANEOUS) ×2 IMPLANT
BAG URINE DRAIN 2000ML AR STRL (UROLOGICAL SUPPLIES) ×2 IMPLANT
CATH FOLEY 3WAY 30CC 22F (CATHETERS) IMPLANT
CATH INTERMIT  6FR 70CM (CATHETERS) ×2 IMPLANT
CLOTH BEACON ORANGE TIMEOUT ST (SAFETY) ×2 IMPLANT
ELECT LOOP 22F BIPOLAR SML (ELECTROSURGICAL) ×2
ELECTRODE LOOP 22F BIPOLAR SML (ELECTROSURGICAL) ×2 IMPLANT
GLOVE BIO SURGEON STRL SZ8 (GLOVE) ×2 IMPLANT
GLOVE BIOGEL PI IND STRL 7.0 (GLOVE) ×4 IMPLANT
GOWN STRL REUS W/TWL LRG LVL3 (GOWN DISPOSABLE) ×2 IMPLANT
GOWN STRL REUS W/TWL XL LVL3 (GOWN DISPOSABLE) ×2 IMPLANT
GUIDEWIRE STR DUAL SENSOR (WIRE) IMPLANT
IV NS IRRIG 3000ML ARTHROMATIC (IV SOLUTION) ×4 IMPLANT
KIT TURNOVER CYSTO (KITS) ×2 IMPLANT
MANIFOLD NEPTUNE II (INSTRUMENTS) ×2 IMPLANT
PACK CYSTO (CUSTOM PROCEDURE TRAY) ×2 IMPLANT
PAD ARMBOARD 7.5X6 YLW CONV (MISCELLANEOUS) ×2 IMPLANT
PLUG CATH AND CAP STER (CATHETERS) IMPLANT
SYR 30ML LL (SYRINGE) ×2 IMPLANT
SYR TOOMEY IRRIG 70ML (MISCELLANEOUS) ×2
SYRINGE TOOMEY IRRIG 70ML (MISCELLANEOUS) ×2 IMPLANT
TOWEL OR 17X26 4PK STRL BLUE (TOWEL DISPOSABLE) ×2 IMPLANT
WATER STERILE IRR 500ML POUR (IV SOLUTION) ×2 IMPLANT

## 2022-08-26 NOTE — Transfer of Care (Signed)
Immediate Anesthesia Transfer of Care Note  Patient: [name]  Procedure(s) Performed: CYSTOSCOPY WITH RETROGRADE PYELOGRAM (Bilateral: Bladder) TRANSURETHRAL RESECTION OF BLADDER TUMOR (TURBT) (Bladder)  Patient Location: PACU  Anesthesia Type:General  Level of Consciousness: awake and alert   Airway & Oxygen Therapy: Patient Spontanous Breathing and Patient connected to nasal cannula oxygen  Post-op Assessment: Report given to RN and Post -op Vital signs reviewed and stable  Post vital signs: Reviewed and stable  Last Vitals:  Vitals Value Taken Time  BP 160/88 08/26/22 1500  Temp 36.7 C 08/26/22 1500  Pulse 83 08/26/22 1501  Resp 15 08/26/22 1501  SpO2 98 % 08/26/22 1501  Vitals shown include unvalidated device data.  Last Pain:  Vitals:   08/26/22 1214  TempSrc: Oral  PainSc: 4       Patients Stated Pain Goal: 6 (03/70/48 8891)  Complications: No notable events documented.

## 2022-08-26 NOTE — Interval H&P Note (Signed)
History and Physical Interval Note:  08/26/2022 12:55 PM  [name]  has presented today for surgery, with the diagnosis of bladder tumor.  The various methods of treatment have been discussed with the patient and family. After consideration of risks, benefits and other options for treatment, the patient has consented to  Procedure(s): CYSTOSCOPY WITH RETROGRADE PYELOGRAM (Bilateral) TRANSURETHRAL RESECTION OF BLADDER TUMOR (TURBT) (N/A) as a surgical intervention.  The patient's history has been reviewed, patient examined, no change in status, stable for surgery.  I have reviewed the patient's chart and labs.  Questions were answered to the patient's satisfaction.     Nicolette Bang

## 2022-08-26 NOTE — Anesthesia Procedure Notes (Signed)
Procedure Name: Intubation Date/Time: 08/26/2022 2:16 PM  Performed by: Eulas Post, Elowyn Raupp W, CRNAPre-anesthesia Checklist: Patient identified, Emergency Drugs available, Suction available and Patient being monitored Patient Re-evaluated:Patient Re-evaluated prior to induction Oxygen Delivery Method: Circle system utilized Preoxygenation: Pre-oxygenation with 100% oxygen Induction Type: IV induction Ventilation: Mask ventilation without difficulty Laryngoscope Size: Miller and 2 Grade View: Grade I Tube type: Oral Tube size: 7.0 mm Number of attempts: 1 Airway Equipment and Method: Stylet Placement Confirmation: ETT inserted through vocal cords under direct vision, positive ETCO2 and breath sounds checked- equal and bilateral Secured at: 21 cm Tube secured with: Tape Dental Injury: Teeth and Oropharynx as per pre-operative assessment

## 2022-08-26 NOTE — Op Note (Signed)
.  Preoperative diagnosis: bladder tumor  Postoperative diagnosis: Same  Procedure: 1 cystoscopy 2. bilateral retrograde pyelography 3.  Intraoperative fluoroscopy, under one hour, with interpretation 4.  Clot evacuation 5. Transurethral resection of bladder tumor, large  Attending: Rosie Fate  Anesthesia: General  Estimated blood loss: Minimal  Drains: 22 French foley  Specimens: bladder tumor  Antibiotics: ancef  Findings: 30cc clot in the bladder. 5cm sessile right lateral wall tumor.  Ureteral orifices in normal anatomic location. No hydronephrosis or filling defects in either collecting system  Indications: Patient is a 61 year old female with a history of bladder tumor and gross hematuria.  After discussing treatment options, they decided proceed with transurethral resection of a bladder tumor.  Procedure in detail: The patient was brought to the operating room and a brief timeout was done to ensure correct patient, correct procedure, correct site.  General anesthesia was administered patient was placed in dorsal lithotomy position.  Their genitalia was then prepped and draped in usual sterile fashion.  A rigid 20 French cystoscope was passed in the urethra and the bladder.  Bladder was inspected and we noted a 30cc of clot and a 5cm bladder tumor.  the ureteral orifices were in the normal orthotopic locations.  a 6 french ureteral catheter was then instilled into the left ureteral orifice.  a gentle retrograde was obtained and findings noted above. We then turned our attention to the right side. a 6 french ureteral catheter was then instilled into the right ureteral orifice.  a gentle retrograde was obtained and findings noted above. We then removed the cystoscope and placed a resectoscope into the bladder. We proceeded to remove the large clot burden from the bladder. Once this was complete we turned our attention to the bladder tumor. Using the bipolar resectoscope we removed  the bladder tumor down to the base. A subsequent muscle deep biopsy was then taken. Hemostasis was then obtained with electrocautery. We then removed the bladder tumor chips and sent them for pathology. We then re-inspected the bladder and found no residula bleeding.  the bladder was then drained, a 22 French foley was placed and this concluded the procedure which was well tolerated by patient.  Complications: None  Condition: Stable, extubated, transferred to PACU  Plan: Patient is to be discharged home and followup in 5 days for foley catheter removal and pathology discussion.

## 2022-08-26 NOTE — Anesthesia Preprocedure Evaluation (Addendum)
Anesthesia Evaluation  Patient identified by MRN, date of birth, ID band Patient awake    Reviewed: Allergy & Precautions, NPO status , Patient's Chart, lab work & pertinent test results  Airway Mallampati: II  TM Distance: >3 FB Neck ROM: Full    Dental  (+) Edentulous Upper, Edentulous Lower   Pulmonary asthma , COPD,  COPD inhaler, former smoker,    Pulmonary exam normal breath sounds clear to auscultation       Cardiovascular Exercise Tolerance: Good hypertension, Pt. on medications Normal cardiovascular exam Rhythm:Regular Rate:Normal     Neuro/Psych negative neurological ROS  negative psych ROS   GI/Hepatic Neg liver ROS, GERD  ,  Endo/Other  Hypothyroidism   Renal/GU negative Renal ROS Bladder dysfunction (Bladder tumor) Female GU complaint (h/o cervical cancer)     Musculoskeletal negative musculoskeletal ROS (+)   Abdominal   Peds negative pediatric ROS (+)  Hematology negative hematology ROS (+)   Anesthesia Other Findings   Reproductive/Obstetrics negative OB ROS                            Anesthesia Physical Anesthesia Plan  ASA: 3  Anesthesia Plan: General   Post-op Pain Management: Dilaudid IV   Induction: Intravenous  PONV Risk Score and Plan: 4 or greater and Ondansetron, Dexamethasone and Midazolam  Airway Management Planned: Oral ETT  Additional Equipment:   Intra-op Plan:   Post-operative Plan: Extubation in OR  Informed Consent: I have reviewed the patients History and Physical, chart, labs and discussed the procedure including the risks, benefits and alternatives for the proposed anesthesia with the patient or authorized representative who has indicated his/her understanding and acceptance.     Dental advisory given  Plan Discussed with: CRNA and Surgeon  Anesthesia Plan Comments:         Anesthesia Quick Evaluation

## 2022-08-26 NOTE — Anesthesia Postprocedure Evaluation (Signed)
Anesthesia Post Note  Patient: [name]  Procedure(s) Performed: CYSTOSCOPY WITH RETROGRADE PYELOGRAM (Bilateral: Bladder) TRANSURETHRAL RESECTION OF BLADDER TUMOR (TURBT) (Bladder)  Patient location during evaluation: Phase II Anesthesia Type: General Level of consciousness: awake and alert and oriented Pain management: pain level controlled Vital Signs Assessment: post-procedure vital signs reviewed and stable Respiratory status: spontaneous breathing, nonlabored ventilation and respiratory function stable Cardiovascular status: blood pressure returned to baseline and stable Postop Assessment: no apparent nausea or vomiting Anesthetic complications: no   No notable events documented.   Last Vitals:  Vitals:   08/26/22 1500 08/26/22 1535  BP: (!) 160/88 (!) 158/72  Pulse: 83 70  Resp: 12 14  Temp: 36.7 C 36.4 C  SpO2: 98% 98%    Last Pain:  Vitals:   08/26/22 1535  TempSrc: Oral  PainSc: 4                  [name] C [name]

## 2022-08-28 LAB — SURGICAL PATHOLOGY

## 2022-09-01 ENCOUNTER — Telehealth: Payer: Self-pay | Admitting: Oncology

## 2022-09-01 ENCOUNTER — Ambulatory Visit (INDEPENDENT_AMBULATORY_CARE_PROVIDER_SITE_OTHER): Payer: 59 | Admitting: Urology

## 2022-09-01 ENCOUNTER — Ambulatory Visit
Admission: RE | Admit: 2022-09-01 | Discharge: 2022-09-01 | Disposition: A | Discharge status: Home / Self Care | Payer: Self-pay | Source: Ambulatory Visit | Attending: Radiation Oncology | Admitting: Radiation Oncology

## 2022-09-01 ENCOUNTER — Encounter: Payer: Self-pay | Admitting: Urology

## 2022-09-01 ENCOUNTER — Other Ambulatory Visit: Payer: Self-pay | Admitting: Radiation Oncology

## 2022-09-01 VITALS — BP 155/82 | HR 85

## 2022-09-01 DIAGNOSIS — D494 Neoplasm of unspecified behavior of bladder: Secondary | ICD-10-CM

## 2022-09-01 DIAGNOSIS — C672 Malignant neoplasm of lateral wall of bladder: Secondary | ICD-10-CM

## 2022-09-01 NOTE — Progress Notes (Signed)
09/01/2022 10:14 AM   [name] March 03, 1961 [id number]  Referring provider: Jake Samples, PA-C [address] [place],  [place] [address]  Followup bladder tumor resection   HPI: [name] is a 61yo here for followup after bladder tumor resection. Pathology was poorly differentiated T1/T2 Bladder cancer. She has a history of pelvic radiation for cervical cancer 25-30 years ago.    PMH: Past Medical History:  Diagnosis Date   Asthma    COPD (chronic obstructive pulmonary disease) (HCC)    GERD (gastroesophageal reflux disease)    Hyperlipidemia    Hypertension    IFG (impaired fasting glucose)    Obesity    Peripheral vertigo    Thyroid disease     Surgical History: Past Surgical History:  Procedure Laterality Date   ABDOMINAL HYSTERECTOMY      Home Medications:  Allergies as of 09/01/2022       Reactions   Augmentin [amoxicillin-pot Clavulanate] Diarrhea, Nausea And Vomiting   Pt decreased dose to QD HS and tolerated well        Medication List        Accurate as of September 01, 2022 10:14 AM. If you have any questions, ask your nurse or doctor.          albuterol 108 (90 Base) MCG/ACT inhaler Commonly known as: VENTOLIN HFA Inhale into the lungs every 6 (six) hours as needed for wheezing or shortness of breath.   hydrochlorothiazide 25 MG tablet Commonly known as: HYDRODIURIL Take 25-50 mg by mouth daily.   lisinopril 20 MG tablet Commonly known as: ZESTRIL Take 20 mg by mouth daily.   oxyCODONE-acetaminophen 5-325 MG tablet Commonly known as: Percocet Take 1 tablet by mouth every 4 (four) hours as needed for severe pain.        Allergies:  Allergies  Allergen Reactions   Augmentin [Amoxicillin-Pot Clavulanate] Diarrhea and Nausea And Vomiting    Pt decreased dose to QD HS and tolerated well    Family History: Family History  Problem Relation Age of Onset   Cancer Mother        colon   Stroke Mother    Cancer  Father        brain   Heart disease Father    Heart disease Maternal Grandmother    Cancer Maternal Grandfather        colon    Social History:  reports that she has quit smoking. She has never used smokeless tobacco. She reports that she does not drink alcohol and does not use drugs.  ROS: All other review of systems were reviewed and are negative except what is noted above in HPI  Physical Exam: BP (!) 155/82   Pulse 85   LMP 02/05/1991 (Approximate)   Constitutional:  Alert and oriented, No acute distress. HEENT: [place] AT, moist mucus membranes.  Trachea midline, no masses. Cardiovascular: No clubbing, cyanosis, or edema. Respiratory: Normal respiratory effort, no increased work of breathing. GI: Abdomen is soft, nontender, nondistended, no abdominal masses GU: No CVA tenderness.  Lymph: No cervical or inguinal lymphadenopathy. Skin: No rashes, bruises or suspicious lesions. Neurologic: Grossly intact, no focal deficits, moving all 4 extremities. Psychiatric: Normal mood and affect.  Laboratory Data: Lab Results  Component Value Date   WBC 4.9 12/03/2018   HGB 14.7 12/03/2018   HCT 43.2 12/03/2018   MCV 91 12/03/2018   PLT 179 12/03/2018    Lab Results  Component Value Date   CREATININE 1.02 (H)  07/15/2022    No results found for: "PSA"  No results found for: "TESTOSTERONE"  No results found for: "HGBA1C"  Urinalysis    Component Value Date/Time   APPEARANCEUR Cloudy (A) 08/18/2022 1045   GLUCOSEU Negative 08/18/2022 1045   BILIRUBINUR Negative 08/18/2022 1045   PROTEINUR 3+ (A) 08/18/2022 1045   NITRITE Negative 08/18/2022 1045   LEUKOCYTESUR 1+ (A) 08/18/2022 1045    Lab Results  Component Value Date   LABMICR Comment 08/18/2022   WBCUA 0-5 07/15/2022   RBCUA 0-2 12/03/2018   LABEPIT 0-10 07/15/2022   MUCUS Present 07/15/2022   BACTERIA Moderate (A) 07/15/2022    Pertinent Imaging:  No results found for this or any previous visit.  No  results found for this or any previous visit.  No results found for this or any previous visit.  No results found for this or any previous visit.  No results found for this or any previous visit.  No results found for this or any previous visit.  Results for orders placed in visit on 07/15/22  CT HEMATURIA WORKUP  Narrative CLINICAL DATA:  Gross hematuria, recurrent UTIs  EXAM: CT ABDOMEN AND PELVIS WITHOUT AND WITH CONTRAST  TECHNIQUE: Multidetector CT imaging of the abdomen and pelvis was performed following the standard protocol before and following the bolus administration of intravenous contrast.  RADIATION DOSE REDUCTION: This exam was performed according to the departmental dose-optimization program which includes automated exposure control, adjustment of the mA and/or kV according to patient size and/or use of iterative reconstruction technique.  CONTRAST:  120m OMNIPAQUE IOHEXOL 300 MG/ML  SOLN  COMPARISON:  None Available.  FINDINGS: Lower chest: No acute abnormality.  Hepatobiliary: No solid liver abnormality is seen. No gallstones, gallbladder wall thickening, or biliary dilatation.  Pancreas: Unremarkable. No pancreatic ductal dilatation or surrounding inflammatory changes.  Spleen: Normal in size without significant abnormality.  Adrenals/Urinary Tract: Adrenal glands are unremarkable. Simple, benign renal cortical cysts of the inferior pole of the right kidney, for which no further follow-up or characterization is required. Kidneys are otherwise normal, without renal calculi, solid lesion, or hydronephrosis. No urinary tract filling defect on delayed phase image. Thickening and hyperenhancement of the urinary bladder wall.  Stomach/Bowel: Stomach is within normal limits. Appendix is surgically absent no evidence of bowel wall thickening, distention, or inflammatory changes. Sigmoid diverticula.  Vascular/Lymphatic: Aortic atherosclerosis. No  enlarged abdominal or pelvic lymph nodes.  Reproductive: Status post hysterectomy. Numerous surgical clips about the low pelvis.  Other: No abdominal wall hernia or abnormality. Small volume free fluid in the low pelvis (series 7, image 71).  Musculoskeletal: No acute or significant osseous findings.  IMPRESSION: 1. Thickening and hyperenhancement of the urinary bladder wall, suggestive of nonspecific infectious or inflammatory cystitis. Correlate with urinalysis. 2. No evidence of urinary tract mass, calculus or hydronephrosis. No urinary tract filling defect on delayed phase imaging 3. Nonspecific small volume ascites in the low pelvis, possibly reactive. 4. Diverticulosis without evidence of acute diverticulitis. 5. Status post hysterectomy.  Aortic Atherosclerosis (ICD10-I70.0).   Electronically Signed By: ADelanna AhmadiM.D. On: 07/21/2022 13:40  No results found for this or any previous visit.   Assessment & Plan:    1. High grade bladder cancer -I discussed the natural hx of muscle invasive bladder cancer and the various treatment options including radical cystectomy versus bladder sparing chemotherapy and radiation. Due to her prior history of pelvic radiation she may not be a candidate fro bladder sparing. I will refer  her to Dr. Tresa Moore, Dr. Tammi Klippel and Dr. Alen Blew.  - Bladder Voiding Trial - Foley catheter - discontinue   No follow-ups on file.  Nicolette Bang, MD  [facility] [place]

## 2022-09-01 NOTE — Progress Notes (Signed)
Fill and Pull Catheter Removal  Patient is present today for a catheter removal.  Patient was cleaned and prepped in a sterile fashion 11m of sterile water/ saline was instilled into the bladder when the patient felt the urge to urinate. 367mof water was then drained from the balloon.  A 22FR foley cath was removed from the bladder no complications were noted .  Patient as then given some time to void on their own.  Patient can void  15063mn their own after some time.  Patient tolerated well.  Performed by: KouLevi AlandMA  Follow up/ Additional notes: F/u with MD

## 2022-09-01 NOTE — Patient Instructions (Signed)

## 2022-09-01 NOTE — Telephone Encounter (Signed)
Scheduled appt per 9/11 referral. Pt is aware of appt date and time. Pt is aware to arrive 15 mins prior to appt time and to bring and updated insurance card. Pt is aware of appt location.   

## 2022-09-01 NOTE — Progress Notes (Signed)
GU Location of Tumor / Histology: poorly differentiated T1/T2 Bladder cancer  [name] presented with signs/symptoms of: gross hematuria  Biopsies revealed:   08/26/2022 FINAL MICROSCOPIC DIAGNOSIS:   A. BLADDER, RESECTION:  High grade poorly differentiated urothelial carcinoma  The carcinoma invades muscularis in the laminar propria  Muscularis propria (detrusor muscle) is present and not involved   Past/Anticipated interventions by urology, if any: radical cystectomy versus bladder sparing chemotherapy and radiation  Past/Anticipated interventions by medical oncology, if any: Has an apt with Dr. Alen Blew on 09/05/2022  Weight changes, if any: {:18581}  Bowel/Bladder complaints, if any: {:18581}   Nausea/Vomiting, if any: {:18581}  Pain issues, if any:  {:18581}  SAFETY ISSUES: Prior radiation? Yes - pelvic radiation for cervical cancer 25-30 years ago Pacemaker/ICD? {:18581} Possible current pregnancy? no Is the patient on methotrexate? {:18581}  Current Complaints / other details:  ***

## 2022-09-02 ENCOUNTER — Telehealth: Payer: Self-pay

## 2022-09-02 ENCOUNTER — Encounter (HOSPITAL_COMMUNITY): Payer: Self-pay | Admitting: Urology

## 2022-09-02 NOTE — Progress Notes (Signed)
Radiation Oncology         ([phone]) [phone] ________________________________  Initial Outpatient Consultation  Name: [name] MRN: [id number]  Date: 09/03/2022  DOB: April 19, 1961  OF:HQRFXJO, Hulen Shouts, PA-C  McKenzie, Candee Furbish, MD   REFERRING PHYSICIAN: Alyson Ingles Candee Furbish, MD  DIAGNOSIS: There were no encounter diagnoses.  Poorly differentiated T1/T2 high-grade bladder cancer  History of cervical cancer in the 90's, s/p hysterectomy and radiation  HISTORY OF PRESENT ILLNESS::[name] is a 61 y.o. female who is accompanied by ***. she is seen as a courtesy of Dr. Alyson Ingles for an opinion concerning radiation therapy as part of management for her recently diagnosed bladder cancer.   The patient presented this summer to her PCP, Daisy Cheadle PA-C, with persistent urinary tract infections and LUTS beginning suddenly in February 2023. Accordingly, the patient was referred to Daisy Come PA-C at [facility] on 07/15/22 for further management. During which time, the patient stated that she had never had a UTI in her life until this past February. Her symptoms at first onset were urinary burning, frequency, stress and urge incontinence, and gross hematuria. On record review, her initial urine culture collected around that time showed 100,000 colonies of Comamonas Testosteronii with MDR and she was treated with Doxycycline. She also reported undergoing  multiple rounds of abx however her symptoms remained similar/persistent. (Patient was also referred to infectious disease for further management but was not able to see them promptly). During this visit, the patient reported near identical symptoms to symptoms present during initial onset in February, other than hematuria. However, she also endorsed sudden onset upper and lower extremity sensation changes with gait imbalance . Additionally, the patient stated that she was told at the time of hysterectomy that her surgeon  "nicked" her bladder, which resulted her having a foley for 3 weeks post-op.   CT hematuria workup ordered by urology on 07/21/22 showed thickening and hyperenhancement of the urinary bladder wall, suggestive of nonspecific infectious or inflammatory cystitis, and nonspecific small volume ascites in the lower pelvis, possibly reactive in etiology. CT otherwise showed no evidence of urinary tract mass, calculus or hydronephrosis.   MRI of the brain with and without contrast on 07/30/22 showed no definite evidence of intracranial metastatic disease. However, multiple single and confluent T2/FLAIR hyperintense foci predominantly in the deep white matter of the cerebral hemispheres were appreciated. These were also seen in the subcortical and periventricular white matter but to a lesser extent.  None of the foci were seen with enhancement or appeared to be acute.  Though nonspecific, the finding was noted to likely reflect moderate chronic microvascular ischemic changes.    (MRI of the cervical spine with and without contrast on 07/30/22 showed spinal stenosis and degenerative changes through out the cervical spine).   Urine culture submitted for cytology on 08/18/22 revealed findings suspicious for high-grade urothelial carcinoma.   The patient opted to proceed with bladder resection on 08/26/22 under the care of Dr. Alyson Ingles. Pathology from the procedure revealed high grade poorly differentiated urothelial carcinoma, positive for invasion of the muscularis in the laminar propria.   Accordingly, the patient followed up with Dr. Alyson Ingles on 09/01/22. Due to her history of pelvic radiation, Dr. Alyson Ingles informed the patient that she may not be a candidate for bladder sparing. Along with a referral to myself, she has also been referred to Dr. Alen Blew.   PREVIOUS RADIATION THERAPY: Yes, pelvic radiation for cervical cancer 25-30 years ago, (reports having 12 weeks  of external radiation performed with [facility]). This was preceded by a hysterectomy in 1992.   PAST MEDICAL HISTORY:  Past Medical History:  Diagnosis Date   Asthma    COPD (chronic obstructive pulmonary disease) (HCC)    GERD (gastroesophageal reflux disease)    Hyperlipidemia    Hypertension    IFG (impaired fasting glucose)    Obesity    Peripheral vertigo    Thyroid disease     PAST SURGICAL HISTORY: Past Surgical History:  Procedure Laterality Date   ABDOMINAL HYSTERECTOMY     CYSTOSCOPY W/ RETROGRADES Bilateral 08/26/2022   Procedure: CYSTOSCOPY WITH RETROGRADE PYELOGRAM;  Surgeon: Cleon Gustin, MD;  Location: [facility];  Service: Urology;  Laterality: Bilateral;   TRANSURETHRAL RESECTION OF BLADDER TUMOR N/A 08/26/2022   Procedure: TRANSURETHRAL RESECTION OF BLADDER TUMOR (TURBT);  Surgeon: Cleon Gustin, MD;  Location: [facility];  Service: Urology;  Laterality: N/A;    FAMILY HISTORY:  Family History  Problem Relation Age of Onset   Cancer Mother        colon   Stroke Mother    Cancer Father        brain   Heart disease Father    Heart disease Maternal Grandmother    Cancer Maternal Grandfather        colon    SOCIAL HISTORY:  Social History   Tobacco Use   Smoking status: Former   Smokeless tobacco: Never  Scientific laboratory technician Use: Never used  Substance Use Topics   Alcohol use: No    Alcohol/week: 0.0 standard drinks of alcohol   Drug use: No    ALLERGIES:  Allergies  Allergen Reactions   Augmentin [Amoxicillin-Pot Clavulanate] Diarrhea and Nausea And Vomiting    Pt decreased dose to QD HS and tolerated well    MEDICATIONS:  Current Outpatient Medications  Medication Sig Dispense Refill   albuterol (VENTOLIN HFA) 108 (90 Base) MCG/ACT inhaler Inhale into the lungs every 6 (six) hours as needed for wheezing or shortness of breath.     hydrochlorothiazide (HYDRODIURIL) 25 MG tablet Take 25-50 mg by mouth daily. (Patient not taking: Reported on 07/28/2022)     lisinopril (ZESTRIL)  20 MG tablet Take 20 mg by mouth daily.     oxyCODONE-acetaminophen (PERCOCET) 5-325 MG tablet Take 1 tablet by mouth every 4 (four) hours as needed for severe pain. 20 tablet 0   Current Facility-Administered Medications  Medication Dose Route Frequency Provider Last Rate Last Admin   ciprofloxacin (CIPRO) tablet 500 mg  500 mg Oral Once McKenzie, Candee Furbish, MD        REVIEW OF SYSTEMS:  A 10+ POINT REVIEW OF SYSTEMS WAS OBTAINED including neurology, dermatology, psychiatry, cardiac, respiratory, lymph, extremities, GI, GU, musculoskeletal, constitutional, reproductive, HEENT. ***   PHYSICAL EXAM:  vitals were not taken for this visit.   General: Alert and oriented, in no acute distress HEENT: Head is normocephalic. Extraocular movements are intact. Oropharynx is clear. Neck: Neck is supple, no palpable cervical or supraclavicular lymphadenopathy. Heart: Regular in rate and rhythm with no murmurs, rubs, or gallops. Chest: Clear to auscultation bilaterally, with no rhonchi, wheezes, or rales. Abdomen: Soft, nontender, nondistended, with no rigidity or guarding. Extremities: No cyanosis or edema. Lymphatics: see Neck Exam Skin: No concerning lesions. Musculoskeletal: symmetric strength and muscle tone throughout. Neurologic: Cranial nerves II through XII are grossly intact. No obvious focalities. Speech is fluent. Coordination is intact. Psychiatric: Judgment and insight are intact.  Affect is appropriate. ***  ECOG = ***  0 - Asymptomatic (Fully active, able to carry on all predisease activities without restriction)  1 - Symptomatic but completely ambulatory (Restricted in physically strenuous activity but ambulatory and able to carry out work of a light or sedentary nature. For example, light housework, office work)  2 - Symptomatic, <50% in bed during the day (Ambulatory and capable of all self care but unable to carry out any work activities. Up and about more than 50% of waking  hours)  3 - Symptomatic, >50% in bed, but not bedbound (Capable of only limited self-care, confined to bed or chair 50% or more of waking hours)  4 - Bedbound (Completely disabled. Cannot carry on any self-care. Totally confined to bed or chair)  5 - Death   Eustace Pen MM, Creech RH, Tormey DC, et al. [phone]). "Toxicity and response criteria of the [facility] Group". [place] Oncol. 5 (6): 649-55  LABORATORY DATA:  Lab Results  Component Value Date   WBC 4.9 12/03/2018   HGB 14.7 12/03/2018   HCT 43.2 12/03/2018   MCV 91 12/03/2018   PLT 179 12/03/2018   NEUTROABS 2.9 12/03/2018   Lab Results  Component Value Date   NA 143 07/15/2022   K 3.7 07/15/2022   CL 105 07/15/2022   CO2 21 07/15/2022   GLUCOSE 75 07/15/2022   BUN 15 07/15/2022   CREATININE 1.02 (H) 07/15/2022   CALCIUM 9.3 07/15/2022      RADIOGRAPHY: DG C-Arm 1-60 Min-No Report  Result Date: 08/26/2022 Fluoroscopy was utilized by the requesting physician.  No radiographic interpretation.      IMPRESSION: Poorly differentiated T1/T2 high-grade bladder cancer  ***  Today, I talked to the patient and family about the findings and work-up thus far.  We discussed the natural history of *** and general treatment, highlighting the role of radiotherapy in the management.  We discussed the available radiation techniques, and focused on the details of logistics and delivery.  We reviewed the anticipated acute and late sequelae associated with radiation in this setting.  The patient was encouraged to ask questions that I answered to the best of my ability. *** A patient consent form was discussed and signed.  We retained a copy for our records.  The patient would like to proceed with radiation and will be scheduled for CT simulation.  PLAN: ***    *** minutes of total time was spent for this patient encounter, including preparation, face-to-face counseling with the patient and coordination of care, physical  exam, and documentation of the encounter.   ------------------------------------------------  Blair Promise, PhD, MD  This document serves as a record of services personally performed by Gery Pray, MD. It was created on his behalf by Roney Mans, a trained medical scribe. The creation of this record is based on the scribe's personal observations and the provider's statements to them. This document has been checked and approved by the attending provider.

## 2022-09-02 NOTE — Telephone Encounter (Signed)
[name], patient's husband called to have oxyCODONE-acetaminophen (PERCOCET) 5-325 MG tablet Sent to  [facility] on [address].  Pt's Physicist, medical.  Thanks, Helene Kelp

## 2022-09-03 ENCOUNTER — Encounter: Payer: Self-pay | Admitting: Radiation Oncology

## 2022-09-03 ENCOUNTER — Ambulatory Visit
Admission: RE | Admit: 2022-09-03 | Discharge: 2022-09-03 | Disposition: A | Discharge status: Home / Self Care | Payer: 59 | Source: Ambulatory Visit | Attending: Radiation Oncology | Admitting: Radiation Oncology

## 2022-09-03 ENCOUNTER — Other Ambulatory Visit: Payer: Self-pay

## 2022-09-03 ENCOUNTER — Other Ambulatory Visit: Payer: Self-pay | Admitting: Urology

## 2022-09-03 VITALS — BP 126/80 | HR 80 | Temp 97.3°F | Resp 20 | Ht 66.0 in | Wt 183.6 lb

## 2022-09-03 DIAGNOSIS — K219 Gastro-esophageal reflux disease without esophagitis: Secondary | ICD-10-CM | POA: Insufficient documentation

## 2022-09-03 DIAGNOSIS — J45909 Unspecified asthma, uncomplicated: Secondary | ICD-10-CM | POA: Insufficient documentation

## 2022-09-03 DIAGNOSIS — Z87891 Personal history of nicotine dependence: Secondary | ICD-10-CM | POA: Insufficient documentation

## 2022-09-03 DIAGNOSIS — N3946 Mixed incontinence: Secondary | ICD-10-CM | POA: Insufficient documentation

## 2022-09-03 DIAGNOSIS — E669 Obesity, unspecified: Secondary | ICD-10-CM | POA: Diagnosis not present

## 2022-09-03 DIAGNOSIS — E785 Hyperlipidemia, unspecified: Secondary | ICD-10-CM | POA: Insufficient documentation

## 2022-09-03 DIAGNOSIS — Z79899 Other long term (current) drug therapy: Secondary | ICD-10-CM | POA: Diagnosis not present

## 2022-09-03 DIAGNOSIS — Z8541 Personal history of malignant neoplasm of cervix uteri: Secondary | ICD-10-CM | POA: Insufficient documentation

## 2022-09-03 DIAGNOSIS — C679 Malignant neoplasm of bladder, unspecified: Secondary | ICD-10-CM | POA: Diagnosis not present

## 2022-09-03 DIAGNOSIS — I10 Essential (primary) hypertension: Secondary | ICD-10-CM | POA: Insufficient documentation

## 2022-09-03 DIAGNOSIS — Z806 Family history of leukemia: Secondary | ICD-10-CM | POA: Diagnosis not present

## 2022-09-03 DIAGNOSIS — R310 Gross hematuria: Secondary | ICD-10-CM | POA: Diagnosis not present

## 2022-09-03 DIAGNOSIS — E079 Disorder of thyroid, unspecified: Secondary | ICD-10-CM | POA: Insufficient documentation

## 2022-09-03 DIAGNOSIS — Z800 Family history of malignant neoplasm of digestive organs: Secondary | ICD-10-CM | POA: Insufficient documentation

## 2022-09-03 MED ORDER — OXYCODONE-ACETAMINOPHEN 5-325 MG PO TABS: 1.0000 | ORAL_TABLET | ORAL | 0 refills | Status: DC | PRN

## 2022-09-04 ENCOUNTER — Other Ambulatory Visit: Payer: Self-pay

## 2022-09-04 MED ORDER — OXYCODONE-ACETAMINOPHEN 5-325 MG PO TABS: 1.0000 | ORAL_TABLET | ORAL | 0 refills | Status: DC | PRN

## 2022-09-04 NOTE — Telephone Encounter (Signed)
Patient called stating insurance will only fill pain medication through [facility] pharmacy.  [facility] pharmacy called and they state pt cannot fill pain medication at there pharmacy. [facility] made aware to cancel pain med rx.  [facility].

## 2022-09-04 NOTE — Progress Notes (Signed)
See other note

## 2022-09-05 ENCOUNTER — Other Ambulatory Visit: Payer: Self-pay

## 2022-09-05 ENCOUNTER — Inpatient Hospital Stay: Payer: 59 | Attending: Oncology | Admitting: Oncology

## 2022-09-05 DIAGNOSIS — C679 Malignant neoplasm of bladder, unspecified: Secondary | ICD-10-CM | POA: Insufficient documentation

## 2022-09-05 NOTE — Progress Notes (Signed)
Reason for the request:    Bladder cancer  HPI: I was asked by Dr. Alyson Ingles to evaluate [name] for the evaluation of bladder cancer.  She is a 61 year old woman with history of hypertension, diabetes and obesity who was found to have hematuria and lower urinary tract symptoms starting in February 2023.  She was treated with antibiotics initially and subsequently underwent a CT hematuria protocol in July 2023.  The CT scan showed thickening of the bladder wall that is suspicious for inflammation.  Urine cytology subsequently showed findings and is suspicious for malignancy.  On August 26, 2022 she underwent TURBT and the final pathology showed high-grade poorly differentiated urothelial carcinoma with invasion into the lamina propria.  No muscle invasion has been identified.  She was evaluated by Dr. Dorathy Daft and felt that she is not a radiation candidate given her previous history of radiation for cervical cancer.  Clinically, she reports complaints of pain and numbness in her upper extremities related to cervical stenosis with plans to have neck surgery in the future.  She has reported weakness in her upper and lower extremities as neuropathy.  She denies any hematuria or dysuria.   She does not report any headaches, blurry vision, syncope or seizures. Does not report any fevers, chills or sweats.  Does not report any cough, wheezing or hemoptysis.  Does not report any chest pain, palpitation, orthopnea or leg edema.  Does not report any nausea, vomiting or abdominal pain.  Does not report any constipation or diarrhea.  Does not report any skeletal complaints.    Does not report frequency, urgency or hematuria.  Does not report any skin rashes or lesions. Does not report any heat or cold intolerance.  Does not report any lymphadenopathy or petechiae.  Does not report any anxiety or depression.  Remaining review of systems is negative.     Past Medical History:  Diagnosis Date   Asthma    GERD  (gastroesophageal reflux disease)    Hyperlipidemia    Hypertension    IFG (impaired fasting glucose)    Obesity    Peripheral vertigo    Thyroid disease   :   Past Surgical History:  Procedure Laterality Date   ABDOMINAL HYSTERECTOMY     CYSTOSCOPY W/ RETROGRADES Bilateral 08/26/2022   Procedure: CYSTOSCOPY WITH RETROGRADE PYELOGRAM;  Surgeon: Cleon Gustin, MD;  Location: [facility];  Service: Urology;  Laterality: Bilateral;   TRANSURETHRAL RESECTION OF BLADDER TUMOR N/A 08/26/2022   Procedure: TRANSURETHRAL RESECTION OF BLADDER TUMOR (TURBT);  Surgeon: Cleon Gustin, MD;  Location: [facility];  Service: Urology;  Laterality: N/A;  :   Current Outpatient Medications:    albuterol (VENTOLIN HFA) 108 (90 Base) MCG/ACT inhaler, Inhale into the lungs every 6 (six) hours as needed for wheezing or shortness of breath., Disp: , Rfl:    hydrochlorothiazide (HYDRODIURIL) 25 MG tablet, Take 25-50 mg by mouth daily., Disp: , Rfl:    lisinopril (ZESTRIL) 20 MG tablet, Take 20 mg by mouth daily., Disp: , Rfl:    oxyCODONE-acetaminophen (PERCOCET) 5-325 MG tablet, Take 1 tablet by mouth every 4 (four) hours as needed for severe pain., Disp: 30 tablet, Rfl: 0  Current Facility-Administered Medications:    ciprofloxacin (CIPRO) tablet 500 mg, 500 mg, Oral, Once, McKenzie, Candee Furbish, MD:   Allergies  Allergen Reactions   Augmentin [Amoxicillin-Pot Clavulanate] Diarrhea and Nausea And Vomiting    Pt decreased dose to QD HS and tolerated well  :   Family  History  Problem Relation Age of Onset   Cancer Mother        colon   Stroke Mother    Heart disease Father    Heart disease Maternal Grandmother    Cancer Maternal Grandfather        colon   Cancer Son        Leukemia  :   Social History   Socioeconomic History   Marital status: Married    Spouse name: Christia Reading   Number of children: 1   Years of education: Not on file   Highest education level: Bachelor's degree (e.g., BA,  AB, BS)  Occupational History   Not on file  Tobacco Use   Smoking status: Former    Types: Cigarettes    Quit date: 1992    Years since quitting: 31.7   Smokeless tobacco: Never  Vaping Use   Vaping Use: Never used  Substance and Sexual Activity   Alcohol use: No    Alcohol/week: 0.0 standard drinks of alcohol   Drug use: No   Sexual activity: Yes  Other Topics Concern   Not on file  Social History Narrative   Lives with husband   R handed   Caffeine: 2 C of coffee a day   Social Determinants of Radio broadcast assistant Strain: Not on file  Food Insecurity: Not on file  Transportation Needs: Not on file  Physical Activity: Not on file  Stress: Not on file  Social Connections: Not on file  Intimate Partner Violence: Not on file  :  Pertinent items are noted in HPI.  Exam:  General appearance: alert and cooperative appeared without distress. Head: atraumatic without any abnormalities. Eyes: conjunctivae/corneas clear. PERRL.  Sclera anicteric. Throat: lips, mucosa, and tongue normal; without oral thrush or ulcers. Resp: clear to auscultation bilaterally without rhonchi, wheezes or dullness to percussion. Cardio: regular rate and rhythm, S1, S2 normal, no murmur, click, rub or gallop GI: soft, non-tender; bowel sounds normal; no masses,  no organomegaly Skin: Skin color, texture, turgor normal. No rashes or lesions Lymph nodes: Cervical, supraclavicular, and axillary nodes normal. Neurologic: Grossly normal without any motor, sensory or deep tendon reflexes. Musculoskeletal: No joint deformity or effusion.    Assessment and Plan:    61 year old with:  Bladder cancer diagnosed in September 2023.  She was found to have high-grade urothelial carcinoma without any clear muscle invasion based on pathology report on August 26, 2022.  The tumor was high-grade poorly differentiated urothelial carcinoma invading into the lamina propria with the muscularis propria  present and not involved.  Treatment options moving forward were discussed.  If we believe that there is muscle invasion then radical cystectomy after systemic chemotherapy is indicated.  But based on the reports that there is indication that the muscularis propria was not involved and superficial bladder tumor such as T1 disease could be treated with intravesicular treatment and a bladder sparing approach.  She will have an evaluation by Dr. Tresa Moore in the near future and a repeat cystoscopy may be required to make definitive diagnosis and confirmation of muscle invasive disease before subjecting her to a radical cystectomy and systemic chemotherapy.  The rationale for using chemotherapy in this setting was discussed.  This will be problematic given her neuropathy and cisplatin could possibly exacerbate her symptoms.  2.  Cervical spinal stenosis: This is problematic for her causing neuropathy and weakness.  It would be reasonable to proceed with treatment of her cervical stenosis of  her bladder cancer is more superficial in nature.  3.  Follow-up: We will be in the next 3 to 4 weeks for a follow-up to discuss next steps after her evaluation with Dr. Tresa Moore.  I will also discuss her case with Dr. Alyson Ingles as well as the reviewing pathologist.  60  minutes were dedicated to this visit. The time was spent on reviewing pathology results, imaging studies, discussing treatment options, discussing differential diagnosis and answering questions regarding future plan.     A copy of this consult has been forwarded to the requesting physician.

## 2022-09-12 DIAGNOSIS — G959 Disease of spinal cord, unspecified: Secondary | ICD-10-CM | POA: Diagnosis not present

## 2022-09-17 ENCOUNTER — Other Ambulatory Visit: Payer: Self-pay | Admitting: Neurological Surgery

## 2022-09-17 ENCOUNTER — Ambulatory Visit: Payer: 59 | Admitting: Urology

## 2022-09-23 NOTE — Pre-Procedure Instructions (Signed)
Surgical Instructions    Your procedure is scheduled on Tuesday October 10.  Report to [facility] Main Entrance "A" at 8:30 A.M., then check in with the Admitting office.  Call this number if you have problems the morning of surgery:  [phone]   If you have any questions prior to your surgery date call [phone]: Open Monday-Friday 8am-4pm If you experience any cold or flu symptoms such as cough, fever, chills, shortness of breath, etc. between now and your scheduled surgery, please notify us at the above number     Remember:  Do not eat after midnight the night before your surgery  You may drink clear liquids until 7:30 the morning of your surgery.   Clear liquids allowed are: Water, Non-Citrus Juices (without pulp), Carbonated Beverages, Clear Tea, Black Coffee ONLY (NO MILK, CREAM OR POWDERED CREAMER of any kind), and Gatorade    Take these medicines the morning of surgery with A SIP OF WATER:  IF NEEDED: albuterol (VENTOLIN HFA) - please bring with you to hospital on day of surgery   As of today, STOP taking any Aspirin (unless otherwise instructed by your surgeon) Aleve, Naproxen, Ibuprofen, Motrin, Advil, Goody's, BC's, all herbal medications, fish oil, and all vitamins.           Do not wear jewelry or makeup. Do not wear lotions, powders, perfumes or deodorant. Do not shave 48 hours prior to surgery. Do not bring valuables to the hospital. [facility] is not responsible for any belongings or valuables.   Do not wear nail polish, gel polish, artificial nails, or any other type of covering on natural nails (fingers and toes) If you have artificial nails or gel coating that need to be removed by a nail salon, please have this removed prior to surgery. Artificial nails or gel coating may interfere with anesthesia's ability to adequately monitor your vital signs.  Do NOT Smoke (Tobacco/Vaping)  24 hours prior to your procedure  If you use a CPAP at night, you may bring  your mask for your overnight stay.   Contacts, glasses, hearing aids, dentures or partials may not be worn into surgery, please bring cases for these belongings   For patients admitted to the hospital, discharge time will be determined by your treatment team.   Patients discharged the day of surgery will not be allowed to drive home, and someone needs to stay with them for 24 hours.   SURGICAL WAITING ROOM VISITATION Patients having surgery or a procedure may have no more than 2 support people in the waiting area - these visitors may rotate.   Children under the age of 75 must have an adult with them who is not the patient. If the patient needs to stay at the hospital during part of their recovery, the visitor guidelines for inpatient rooms apply. Pre-op nurse will coordinate an appropriate time for 1 support person to accompany patient in pre-op.  This support person may not rotate.   Please refer to the [facility] website for the visitor guidelines for Inpatients (after your surgery is over and you are in a regular room).    Special instructions:    Oral Hygiene is also important to reduce your risk of infection.  Remember - BRUSH YOUR TEETH THE MORNING OF SURGERY WITH YOUR REGULAR TOOTHPASTE   [place]- Preparing For Surgery  Before surgery, you can play an important role. Because skin is not sterile, your skin needs to be as free of germs as possible.  You can reduce the number of germs on your skin by washing with CHG (chlorahexidine gluconate) Soap before surgery.  CHG is an antiseptic cleaner which kills germs and bonds with the skin to continue killing germs even after washing.     Please do not use if you have an allergy to CHG or antibacterial soaps. If your skin becomes reddened/irritated stop using the CHG.  Do not shave (including legs and underarms) for at least 48 hours prior to first CHG shower. It is OK to shave your face.  Please follow these instructions  carefully.     Shower the NIGHT BEFORE SURGERY and the MORNING OF SURGERY with CHG Soap.   If you chose to wash your hair, wash your hair first as usual with your normal shampoo. After you shampoo, rinse your hair and body thoroughly to remove the shampoo.  Then [employer] and genitals (private parts) with your normal soap and rinse thoroughly to remove soap.  After that Use CHG Soap as you would any other liquid soap. You can apply CHG directly to the skin and wash gently with a scrungie or a clean washcloth.   Apply the CHG Soap to your body ONLY FROM THE NECK DOWN.  Do not use on open wounds or open sores. Avoid contact with your eyes, ears, mouth and genitals (private parts). Wash Face and genitals (private parts)  with your normal soap.   Wash thoroughly, paying special attention to the area where your surgery will be performed.  Thoroughly rinse your body with warm water from the neck down.  DO NOT shower/wash with your normal soap after using and rinsing off the CHG Soap.  Pat yourself dry with a CLEAN TOWEL.  Wear CLEAN PAJAMAS to bed the night before surgery  Place CLEAN SHEETS on your bed the night before your surgery  DO NOT SLEEP WITH PETS.   Day of Surgery:  Take a shower with CHG soap. Wear Clean/Comfortable clothing the morning of surgery Do not apply any deodorants/lotions.   Remember to brush your teeth WITH YOUR REGULAR TOOTHPASTE.    If you received a COVID test during your pre-op visit, it is requested that you wear a mask when out in public, stay away from anyone that may not be feeling well, and notify your surgeon if you develop symptoms. If you have been in contact with anyone that has tested positive in the last 10 days, please notify your surgeon.    Please read over the following fact sheets that you were given.

## 2022-09-24 ENCOUNTER — Encounter (HOSPITAL_COMMUNITY)
Admission: RE | Admit: 2022-09-24 | Discharge: 2022-09-24 | Disposition: A | Discharge status: Home / Self Care | Payer: 59 | Source: Ambulatory Visit | Attending: Neurological Surgery | Admitting: Neurological Surgery

## 2022-09-24 ENCOUNTER — Encounter (HOSPITAL_COMMUNITY): Payer: Self-pay

## 2022-09-24 ENCOUNTER — Other Ambulatory Visit: Payer: Self-pay

## 2022-09-24 VITALS — BP 128/73 | HR 87 | Temp 97.9°F | Resp 17 | Ht 66.0 in | Wt 181.6 lb

## 2022-09-24 DIAGNOSIS — Z8541 Personal history of malignant neoplasm of cervix uteri: Secondary | ICD-10-CM | POA: Insufficient documentation

## 2022-09-24 DIAGNOSIS — E785 Hyperlipidemia, unspecified: Secondary | ICD-10-CM | POA: Insufficient documentation

## 2022-09-24 DIAGNOSIS — C679 Malignant neoplasm of bladder, unspecified: Secondary | ICD-10-CM | POA: Diagnosis not present

## 2022-09-24 DIAGNOSIS — Z923 Personal history of irradiation: Secondary | ICD-10-CM | POA: Insufficient documentation

## 2022-09-24 DIAGNOSIS — I10 Essential (primary) hypertension: Secondary | ICD-10-CM | POA: Diagnosis not present

## 2022-09-24 DIAGNOSIS — Z01818 Encounter for other preprocedural examination: Secondary | ICD-10-CM

## 2022-09-24 DIAGNOSIS — E079 Disorder of thyroid, unspecified: Secondary | ICD-10-CM | POA: Diagnosis not present

## 2022-09-24 DIAGNOSIS — Z87891 Personal history of nicotine dependence: Secondary | ICD-10-CM | POA: Insufficient documentation

## 2022-09-24 DIAGNOSIS — J45909 Unspecified asthma, uncomplicated: Secondary | ICD-10-CM | POA: Diagnosis not present

## 2022-09-24 DIAGNOSIS — E782 Mixed hyperlipidemia: Secondary | ICD-10-CM

## 2022-09-24 DIAGNOSIS — Z01812 Encounter for preprocedural laboratory examination: Secondary | ICD-10-CM | POA: Insufficient documentation

## 2022-09-24 LAB — BASIC METABOLIC PANEL
Anion gap: 5 (ref 5–15)
BUN: 17 mg/dL (ref 8–23)
CO2: 28 mmol/L (ref 22–32)
Calcium: 9.2 mg/dL (ref 8.9–10.3)
Chloride: 109 mmol/L (ref 98–111)
Creatinine, Ser: 1.25 mg/dL — ABNORMAL HIGH (ref 0.44–1.00)
GFR, Estimated: 48 mL/min — ABNORMAL LOW (ref >60)
Glucose, Bld: 124 mg/dL — ABNORMAL HIGH (ref 70–99)
Potassium: 3.8 mmol/L (ref 3.5–5.1)
Sodium: 142 mmol/L (ref 135–145)

## 2022-09-24 LAB — TYPE AND SCREEN
ABO/RH(D): O POS
Antibody Screen: NEGATIVE

## 2022-09-24 LAB — CBC
HCT: 40.2 % (ref 36.0–46.0)
Hemoglobin: 12.9 g/dL (ref 12.0–15.0)
MCH: 30.4 pg (ref 26.0–34.0)
MCHC: 32.1 g/dL (ref 30.0–36.0)
MCV: 94.6 fL (ref 80.0–100.0)
Platelets: 156 10*3/uL (ref 150–400)
RBC: 4.25 MIL/uL (ref 3.87–5.11)
RDW: 12.9 % (ref 11.5–15.5)
WBC: 4.8 10*3/uL (ref 4.0–10.5)
nRBC: 0 % (ref 0.0–0.2)

## 2022-09-24 LAB — SURGICAL PCR SCREEN
MRSA, PCR: NEGATIVE
Staphylococcus aureus: POSITIVE — AB

## 2022-09-24 NOTE — Progress Notes (Signed)
PCP - Delman Cheadle, PA Cardiologist - Denies  PPM/ICD - Denies  Chest x-ray - NI EKG - 08/21/22 Stress Test - Denies ECHO - Denies Cardiac Cath - Denies  Sleep Study - Denies  Denies- DM  Blood Thinner Instructions:Deneis Aspirin Instructions:Denies  ERAS Protcol -Yes   COVID TEST- Ni   Anesthesia review: No  Patient denies shortness of breath, fever, cough and chest pain at PAT appointment   All instructions explained to the patient, with a verbal understanding of the material. Patient agrees to go over the instructions while at home for a better understanding.  The opportunity to ask questions was provided.

## 2022-09-25 NOTE — Progress Notes (Signed)
Anesthesia Chart Review:  Case: [id number] Date/Time: 09/30/22 1015   Procedure: C4-5 C5-6 C6-7 ACDF - 3C/RM 18   Anesthesia type: General   Pre-op diagnosis: Cervical Myelopathy   Location: [facility] OR ROOM 21 / [place] OR   Surgeons: Judith Part, MD       DISCUSSION: Patient is a 61 year old female scheduled for the above procedure.  History includes former smoker (quit 12/22/90), HTN, HLD, impaired fasting glucose, GERD, asthma, thyroid disease (not specified; transient mild elevation of TSH 06/13/17), bladder cancer (s/p TURBT 08/26/22 + high-grade poorly differentiated urothelial carcinoma), cervical cancer (hysterectomy, radiation 1990's).   She had oncology evaluation by Dr. Alen Blew on 09/05/22. She will likely  require chemotherapy in the future for bladder cancer with invasion into the lamina propria. If RAD-ONC Dr. Chesley Noon thought she may need radical cystectomy since she was not felt to be a candidate for radiation given history of radiation for cervical cancer. Dr. Alen Blew thought she might need a future repeat cystoscopy to establish is there was muscle invasion of her urothelial carcinoma before determining therapy. In addition, there was concern that if chemotherapy was initiated that it may exacerbate her cervical myelopathy symptoms of weakness and neuropathy. She discussed that she would need neurosurgery. Given her symptoms, he wrote, "It would be reasonable to proceed with treatment of her cervical stenosis of her bladder cancer is more superficial in nature."   Anesthesia team to evaluate on the day of surgery.   VS: BP 128/73   Pulse 87   Temp 36.6 C   Resp 17   Ht '5\' 6"'$  (1.676 m)   Wt 82.4 kg   LMP 02/05/1991 (Approximate)   SpO2 98%   BMI 29.31 kg/m    PROVIDERS: Jake Samples, PA-C is PCP  Nicolette Bang, MD is urologist Zola Button, MD is HEM-ONC Arlice Colt, MD is neurologist   LABS: Labs reviewed: Acceptable for surgery. (all labs ordered are  listed, but only abnormal results are displayed)  Labs Reviewed  SURGICAL PCR SCREEN - Abnormal; Notable for the following components:      Result Value   Staphylococcus aureus POSITIVE (*)    All other components within normal limits  BASIC METABOLIC PANEL - Abnormal; Notable for the following components:   Glucose, Bld 124 (*)    Creatinine, Ser 1.25 (*)    GFR, Estimated 48 (*)    All other components within normal limits  CBC  TYPE AND SCREEN     IMAGES: MRI C-spine 07/30/22: IMPRESSION: This MRI of the cervical spine with and without contrast shows the following: Increased signal within the spinal cord adjacent to C4-C5 where there is moderately severe spinal stenosis.  This is consistent with cervical myelopathy.  More subtle increased signal also noted adjacent to C6-C7 but only on the sagittal STIR images where there is severe spinal stenosis. At C3-C4, there are degenerative changes causing mild spinal stenosis and moderately severe right foraminal narrowing with potential for right C4 nerve root compression. At C4-C5, there is moderately severe spinal stenosis (AP diameter 6.6 mm) due to minimal anterolisthesis and other degenerative change.  Increase signal is seen within the spinal cord on the sagittal and to lesser extent axial T2 weighted images consistent with myelopathy.  There is severe left foraminal narrowing with potential for left C5 nerve root compression. At C5-C6, there is moderately severe spinal stenosis (AP diameter 6.8 mm) due to disc protrusion and other degenerative changes.  There is moderate bilateral foraminal  narrowing At C6-C7, there is severe spinal stenosis (AP diameter 5.8 mm) due to disc protrusion and other degenerative changes.  There is moderate foraminal narrowing but no definite nerve root compression.   MRI Brain 07/30/22: IMPRESSION: This MRI of the brain with and without contrast shows the following: Multiple single and confluent T2/FLAIR  hyperintense foci predominantly in the deep white matter of the cerebral hemispheres but also in the subcortical and periventricular white matter.  None of the foci enhance or appear to be acute.  Though nonspecific, the finding is most likely due to moderate chronic microvascular ischemic change.  Superimposed demyelination is unlikely but cannot be ruled out. Mild left maxillary chronic sinusitis.   No acute findings.  Normal enhancement pattern.    EKG: 08/21/22:  Normal sinus rhythm Rightward axis Borderline ECG No previous ECGs available Confirmed by Asencion Noble [phone]) on 08/22/2022 6:42:31 AM   CV: N/A  Past Medical History:  Diagnosis Date   Asthma    Cancer ([place]) 07/2022   GERD (gastroesophageal reflux disease)    no longer suffering from   Hyperlipidemia    Hypertension    IFG (impaired fasting glucose)    Obesity    Peripheral vertigo    Thyroid disease     Past Surgical History:  Procedure Laterality Date   ABDOMINAL HYSTERECTOMY     APPENDECTOMY     CYSTOSCOPY W/ RETROGRADES Bilateral 08/26/2022   Procedure: CYSTOSCOPY WITH RETROGRADE PYELOGRAM;  Surgeon: Cleon Gustin, MD;  Location: [facility];  Service: Urology;  Laterality: Bilateral;   TRANSURETHRAL RESECTION OF BLADDER TUMOR N/A 08/26/2022   Procedure: TRANSURETHRAL RESECTION OF BLADDER TUMOR (TURBT);  Surgeon: Cleon Gustin, MD;  Location: [facility];  Service: Urology;  Laterality: N/A;    MEDICATIONS:  albuterol (VENTOLIN HFA) 108 (90 Base) MCG/ACT inhaler   gabapentin (NEURONTIN) 300 MG capsule   hydrochlorothiazide (HYDRODIURIL) 25 MG tablet   lisinopril (ZESTRIL) 20 MG tablet   oxyCODONE-acetaminophen (PERCOCET) 5-325 MG tablet    ciprofloxacin (CIPRO) tablet 500 mg     Myra Gianotti, PA-C Surgical Short Stay/Anesthesiology [facility] Phone [phone] [facility] Phone [phone] 09/25/2022 1:19 PM

## 2022-09-25 NOTE — Anesthesia Preprocedure Evaluation (Addendum)
Anesthesia Evaluation  Patient identified by MRN, date of birth, ID band Patient awake    Reviewed: Allergy & Precautions, NPO status , Patient's Chart, lab work & pertinent test results  History of Anesthesia Complications Negative for: history of anesthetic complications  Airway Mallampati: II  TM Distance: >3 FB Neck ROM: Full    Dental  (+) Edentulous Upper, Edentulous Lower   Pulmonary COPD,  COPD inhaler, former smoker,    breath sounds clear to auscultation       Cardiovascular hypertension, Pt. on medications (-) angina Rhythm:Regular Rate:Normal     Neuro/Psych bilat arm and leg weakness    GI/Hepatic Neg liver ROS, GERD  Controlled,  Endo/Other  negative endocrine ROS  Renal/GU Renal InsufficiencyRenal disease   Bladder cancer    Musculoskeletal   Abdominal   Peds  Hematology negative hematology ROS (+)   Anesthesia Other Findings   Reproductive/Obstetrics                           Anesthesia Physical Anesthesia Plan  ASA: 3  Anesthesia Plan: General   Post-op Pain Management: Tylenol PO (pre-op)*   Induction: Intravenous  PONV Risk Score and Plan: 3 and Ondansetron, Dexamethasone, Treatment may vary due to age or medical condition and Scopolamine patch - Pre-op  Airway Management Planned: Oral ETT and Video Laryngoscope Planned  Additional Equipment: None  Intra-op Plan:   Post-operative Plan: Extubation in OR  Informed Consent: I have reviewed the patients History and Physical, chart, labs and discussed the procedure including the risks, benefits and alternatives for the proposed anesthesia with the patient or authorized representative who has indicated his/her understanding and acceptance.       Plan Discussed with: CRNA and Surgeon  Anesthesia Plan Comments: (PAT note written 09/25/2022 by Myra Gianotti, PA-C. )      Anesthesia Quick Evaluation

## 2022-09-29 NOTE — Progress Notes (Signed)
Pt's husband called to inform that the pt was running a fever of 99. Advised that the pt get a covid test and to call the surgeon's office with the symptoms and results prior to the pt coming in for surgery tomorrow.

## 2022-09-30 ENCOUNTER — Other Ambulatory Visit: Payer: Self-pay

## 2022-09-30 ENCOUNTER — Ambulatory Visit (HOSPITAL_COMMUNITY): Payer: 59 | Admitting: Vascular Surgery

## 2022-09-30 ENCOUNTER — Encounter (HOSPITAL_COMMUNITY): Payer: Self-pay | Admitting: Neurological Surgery

## 2022-09-30 ENCOUNTER — Ambulatory Visit (HOSPITAL_BASED_OUTPATIENT_CLINIC_OR_DEPARTMENT_OTHER): Payer: 59 | Admitting: Certified Registered"

## 2022-09-30 ENCOUNTER — Encounter (HOSPITAL_COMMUNITY)
Admission: RE | Disposition: A | Discharge status: Home / Self Care | Payer: Self-pay | Source: Home / Self Care | Attending: Neurological Surgery

## 2022-09-30 ENCOUNTER — Observation Stay (HOSPITAL_COMMUNITY)
Admission: RE | Admit: 2022-09-30 | Discharge: 2022-10-01 | Disposition: A | Discharge status: Home / Self Care | Payer: 59 | Attending: Neurological Surgery | Admitting: Neurological Surgery

## 2022-09-30 ENCOUNTER — Ambulatory Visit (HOSPITAL_COMMUNITY): Payer: 59

## 2022-09-30 DIAGNOSIS — M50021 Cervical disc disorder at C4-C5 level with myelopathy: Secondary | ICD-10-CM | POA: Diagnosis not present

## 2022-09-30 DIAGNOSIS — Z87891 Personal history of nicotine dependence: Secondary | ICD-10-CM | POA: Diagnosis not present

## 2022-09-30 DIAGNOSIS — J449 Chronic obstructive pulmonary disease, unspecified: Secondary | ICD-10-CM

## 2022-09-30 DIAGNOSIS — I10 Essential (primary) hypertension: Secondary | ICD-10-CM | POA: Diagnosis not present

## 2022-09-30 DIAGNOSIS — J45909 Unspecified asthma, uncomplicated: Secondary | ICD-10-CM | POA: Insufficient documentation

## 2022-09-30 DIAGNOSIS — Z9889 Other specified postprocedural states: Secondary | ICD-10-CM | POA: Diagnosis not present

## 2022-09-30 DIAGNOSIS — M50023 Cervical disc disorder at C6-C7 level with myelopathy: Secondary | ICD-10-CM | POA: Diagnosis not present

## 2022-09-30 DIAGNOSIS — G959 Disease of spinal cord, unspecified: Secondary | ICD-10-CM | POA: Diagnosis present

## 2022-09-30 DIAGNOSIS — M4802 Spinal stenosis, cervical region: Secondary | ICD-10-CM | POA: Diagnosis not present

## 2022-09-30 DIAGNOSIS — M50022 Cervical disc disorder at C5-C6 level with myelopathy: Secondary | ICD-10-CM | POA: Diagnosis not present

## 2022-09-30 DIAGNOSIS — G992 Myelopathy in diseases classified elsewhere: Secondary | ICD-10-CM | POA: Diagnosis not present

## 2022-09-30 HISTORY — PX: ANTERIOR CERVICAL DECOMP/DISCECTOMY FUSION: SHX1161

## 2022-09-30 LAB — ABO/RH: ABO/RH(D): O POS

## 2022-09-30 SURGERY — ANTERIOR CERVICAL DECOMPRESSION/DISCECTOMY FUSION 3 LEVELS
Anesthesia: General | Site: Spine Cervical

## 2022-09-30 MED ORDER — VANCOMYCIN HCL IN DEXTROSE 1-5 GM/200ML-% IV SOLN: 1000.0000 mg | INTRAVENOUS | Status: AC

## 2022-09-30 MED ORDER — OXYCODONE HCL 5 MG PO TABS: 5.0000 mg | ORAL_TABLET | Freq: Once | ORAL | Status: DC | PRN

## 2022-09-30 MED ORDER — ROCURONIUM BROMIDE 10 MG/ML (PF) SYRINGE
PREFILLED_SYRINGE | INTRAVENOUS | Status: DC | PRN
  Administered 2022-09-30: 60 mg via INTRAVENOUS
  Administered 2022-09-30: 40 mg via INTRAVENOUS
  Administered 2022-09-30: 20 mg via INTRAVENOUS

## 2022-09-30 MED ORDER — 0.9 % SODIUM CHLORIDE (POUR BTL) OPTIME
TOPICAL | Status: DC | PRN
  Administered 2022-09-30: 1000 mL

## 2022-09-30 MED ORDER — HYDROMORPHONE HCL 1 MG/ML IJ SOLN
1.0000 mg | INTRAMUSCULAR | Status: DC | PRN
  Administered 2022-09-30: 1 mg via INTRAVENOUS
  Filled 2022-09-30: qty 1

## 2022-09-30 MED ORDER — DEXAMETHASONE SODIUM PHOSPHATE 10 MG/ML IJ SOLN
INTRAMUSCULAR | Status: AC
  Filled 2022-09-30: qty 1

## 2022-09-30 MED ORDER — ONDANSETRON HCL 4 MG PO TABS: 4.0000 mg | ORAL_TABLET | Freq: Four times a day (QID) | ORAL | Status: DC | PRN

## 2022-09-30 MED ORDER — ORAL CARE MOUTH RINSE: 15.0000 mL | Freq: Once | OROMUCOSAL | Status: AC

## 2022-09-30 MED ORDER — PHENYLEPHRINE HCL-NACL 20-0.9 MG/250ML-% IV SOLN
INTRAVENOUS | Status: DC | PRN
  Administered 2022-09-30: 40 ug/min via INTRAVENOUS

## 2022-09-30 MED ORDER — CHLORHEXIDINE GLUCONATE CLOTH 2 % EX PADS: 6.0000 | MEDICATED_PAD | Freq: Once | CUTANEOUS | Status: DC

## 2022-09-30 MED ORDER — SODIUM CHLORIDE 0.9 % IV SOLN
250.0000 mL | INTRAVENOUS | Status: DC
  Administered 2022-09-30: 250 mL via INTRAVENOUS

## 2022-09-30 MED ORDER — FENTANYL CITRATE (PF) 250 MCG/5ML IJ SOLN
INTRAMUSCULAR | Status: DC | PRN
  Administered 2022-09-30: 25 ug via INTRAVENOUS
  Administered 2022-09-30: 250 ug via INTRAVENOUS
  Administered 2022-09-30: 50 ug via INTRAVENOUS

## 2022-09-30 MED ORDER — GABAPENTIN 300 MG PO CAPS
300.0000 mg | ORAL_CAPSULE | Freq: Every day | ORAL | Status: DC
  Administered 2022-09-30: 300 mg via ORAL
  Filled 2022-09-30: qty 1

## 2022-09-30 MED ORDER — FENTANYL CITRATE (PF) 250 MCG/5ML IJ SOLN
INTRAMUSCULAR | Status: AC
  Filled 2022-09-30: qty 5

## 2022-09-30 MED ORDER — LACTATED RINGERS IV SOLN: INTRAVENOUS | Status: DC

## 2022-09-30 MED ORDER — MIDAZOLAM HCL 2 MG/2ML IJ SOLN
INTRAMUSCULAR | Status: DC | PRN
  Administered 2022-09-30: 2 mg via INTRAVENOUS

## 2022-09-30 MED ORDER — ONDANSETRON HCL 4 MG/2ML IJ SOLN: 4.0000 mg | Freq: Four times a day (QID) | INTRAMUSCULAR | Status: DC | PRN

## 2022-09-30 MED ORDER — PHENOL 1.4 % MT LIQD: 1.0000 | OROMUCOSAL | Status: DC | PRN

## 2022-09-30 MED ORDER — CHLORHEXIDINE GLUCONATE 0.12 % MT SOLN
OROMUCOSAL | Status: AC
  Administered 2022-09-30: 15 mL via OROMUCOSAL
  Filled 2022-09-30: qty 15

## 2022-09-30 MED ORDER — OXYCODONE HCL 5 MG PO TABS
10.0000 mg | ORAL_TABLET | ORAL | Status: DC | PRN
  Administered 2022-09-30 – 2022-10-01 (×5): 10 mg via ORAL
  Filled 2022-09-30 (×5): qty 2

## 2022-09-30 MED ORDER — SODIUM CHLORIDE 0.9% FLUSH
3.0000 mL | Freq: Two times a day (BID) | INTRAVENOUS | Status: DC
  Administered 2022-09-30: 3 mL via INTRAVENOUS

## 2022-09-30 MED ORDER — SUGAMMADEX SODIUM 200 MG/2ML IV SOLN
INTRAVENOUS | Status: DC | PRN
  Administered 2022-09-30: 200 mg via INTRAVENOUS

## 2022-09-30 MED ORDER — PROPOFOL 10 MG/ML IV BOLUS
INTRAVENOUS | Status: DC | PRN
  Administered 2022-09-30: 100 mg via INTRAVENOUS

## 2022-09-30 MED ORDER — MIDAZOLAM HCL 2 MG/2ML IJ SOLN
INTRAMUSCULAR | Status: AC
  Filled 2022-09-30: qty 2

## 2022-09-30 MED ORDER — PROPOFOL 10 MG/ML IV BOLUS
INTRAVENOUS | Status: AC
  Filled 2022-09-30: qty 20

## 2022-09-30 MED ORDER — MENTHOL 3 MG MT LOZG: 1.0000 | LOZENGE | OROMUCOSAL | Status: DC | PRN

## 2022-09-30 MED ORDER — OXYCODONE HCL 5 MG/5ML PO SOLN: 5.0000 mg | Freq: Once | ORAL | Status: DC | PRN

## 2022-09-30 MED ORDER — PROMETHAZINE HCL 25 MG/ML IJ SOLN: 6.2500 mg | INTRAMUSCULAR | Status: DC | PRN

## 2022-09-30 MED ORDER — CEFAZOLIN SODIUM-DEXTROSE 2-4 GM/100ML-% IV SOLN
2.0000 g | Freq: Three times a day (TID) | INTRAVENOUS | Status: AC
  Administered 2022-09-30 (×2): 2 g via INTRAVENOUS
  Filled 2022-09-30 (×2): qty 100

## 2022-09-30 MED ORDER — LIDOCAINE-EPINEPHRINE 1 %-1:100000 IJ SOLN
INTRAMUSCULAR | Status: AC
  Filled 2022-09-30: qty 1

## 2022-09-30 MED ORDER — PHENYLEPHRINE 80 MCG/ML (10ML) SYRINGE FOR IV PUSH (FOR BLOOD PRESSURE SUPPORT)
PREFILLED_SYRINGE | INTRAVENOUS | Status: DC | PRN
  Administered 2022-09-30: 160 ug via INTRAVENOUS

## 2022-09-30 MED ORDER — ONDANSETRON HCL 4 MG/2ML IJ SOLN
INTRAMUSCULAR | Status: AC
  Filled 2022-09-30: qty 2

## 2022-09-30 MED ORDER — ONDANSETRON HCL 4 MG/2ML IJ SOLN
INTRAMUSCULAR | Status: DC | PRN
  Administered 2022-09-30: 4 mg via INTRAVENOUS

## 2022-09-30 MED ORDER — EPHEDRINE SULFATE-NACL 50-0.9 MG/10ML-% IV SOSY
PREFILLED_SYRINGE | INTRAVENOUS | Status: DC | PRN
  Administered 2022-09-30: 5 mg via INTRAVENOUS

## 2022-09-30 MED ORDER — LIDOCAINE 2% (20 MG/ML) 5 ML SYRINGE
INTRAMUSCULAR | Status: DC | PRN
  Administered 2022-09-30: 100 mg via INTRAVENOUS

## 2022-09-30 MED ORDER — ROCURONIUM BROMIDE 10 MG/ML (PF) SYRINGE
PREFILLED_SYRINGE | INTRAVENOUS | Status: AC
  Filled 2022-09-30: qty 10

## 2022-09-30 MED ORDER — POLYETHYLENE GLYCOL 3350 17 G PO PACK: 17.0000 g | PACK | Freq: Every day | ORAL | Status: DC | PRN

## 2022-09-30 MED ORDER — ACETAMINOPHEN 650 MG RE SUPP: 650.0000 mg | RECTAL | Status: DC | PRN

## 2022-09-30 MED ORDER — THROMBIN 5000 UNITS EX SOLR
OROMUCOSAL | Status: DC | PRN
  Administered 2022-09-30: 5 mL via TOPICAL

## 2022-09-30 MED ORDER — ALBUTEROL SULFATE HFA 108 (90 BASE) MCG/ACT IN AERS: 1.0000 | INHALATION_SPRAY | Freq: Four times a day (QID) | RESPIRATORY_TRACT | Status: DC | PRN

## 2022-09-30 MED ORDER — CYCLOBENZAPRINE HCL 10 MG PO TABS
10.0000 mg | ORAL_TABLET | Freq: Three times a day (TID) | ORAL | Status: DC | PRN
  Administered 2022-09-30 – 2022-10-01 (×2): 10 mg via ORAL
  Filled 2022-09-30 (×2): qty 1

## 2022-09-30 MED ORDER — ALBUTEROL SULFATE (2.5 MG/3ML) 0.083% IN NEBU: 2.5000 mg | INHALATION_SOLUTION | Freq: Four times a day (QID) | RESPIRATORY_TRACT | Status: DC | PRN

## 2022-09-30 MED ORDER — DOCUSATE SODIUM 100 MG PO CAPS
100.0000 mg | ORAL_CAPSULE | Freq: Two times a day (BID) | ORAL | Status: DC
  Administered 2022-09-30 – 2022-10-01 (×3): 100 mg via ORAL
  Filled 2022-09-30 (×3): qty 1

## 2022-09-30 MED ORDER — THROMBIN 5000 UNITS EX SOLR
CUTANEOUS | Status: AC
  Filled 2022-09-30: qty 5000

## 2022-09-30 MED ORDER — DEXAMETHASONE SODIUM PHOSPHATE 10 MG/ML IJ SOLN
INTRAMUSCULAR | Status: DC | PRN
  Administered 2022-09-30: 10 mg via INTRAVENOUS

## 2022-09-30 MED ORDER — MEPERIDINE HCL 25 MG/ML IJ SOLN: 6.2500 mg | INTRAMUSCULAR | Status: DC | PRN

## 2022-09-30 MED ORDER — LIDOCAINE-EPINEPHRINE 1 %-1:100000 IJ SOLN
INTRAMUSCULAR | Status: DC | PRN
  Administered 2022-09-30: 10 mL

## 2022-09-30 MED ORDER — HYDROCHLOROTHIAZIDE 25 MG PO TABS: 25.0000 mg | ORAL_TABLET | ORAL | Status: DC

## 2022-09-30 MED ORDER — PHENYLEPHRINE HCL (PRESSORS) 10 MG/ML IV SOLN
INTRAVENOUS | Status: AC
  Filled 2022-09-30: qty 1

## 2022-09-30 MED ORDER — ACETAMINOPHEN 325 MG PO TABS: 650.0000 mg | ORAL_TABLET | ORAL | Status: DC | PRN

## 2022-09-30 MED ORDER — MIDAZOLAM HCL 2 MG/2ML IJ SOLN: 0.5000 mg | Freq: Once | INTRAMUSCULAR | Status: DC | PRN

## 2022-09-30 MED ORDER — VANCOMYCIN HCL IN DEXTROSE 1-5 GM/200ML-% IV SOLN
INTRAVENOUS | Status: AC
  Administered 2022-09-30: 1000 mg via INTRAVENOUS
  Filled 2022-09-30: qty 200

## 2022-09-30 MED ORDER — ACETAMINOPHEN 500 MG PO TABS
1000.0000 mg | ORAL_TABLET | Freq: Once | ORAL | Status: AC
  Administered 2022-09-30: 1000 mg via ORAL
  Filled 2022-09-30: qty 2

## 2022-09-30 MED ORDER — HYDROMORPHONE HCL 1 MG/ML IJ SOLN: 0.2500 mg | INTRAMUSCULAR | Status: DC | PRN

## 2022-09-30 MED ORDER — SCOPOLAMINE 1 MG/3DAYS TD PT72
1.0000 | MEDICATED_PATCH | TRANSDERMAL | Status: DC
  Administered 2022-09-30: 1.5 mg via TRANSDERMAL
  Filled 2022-09-30: qty 1

## 2022-09-30 MED ORDER — SODIUM CHLORIDE 0.9% FLUSH: 3.0000 mL | INTRAVENOUS | Status: DC | PRN

## 2022-09-30 MED ORDER — OXYCODONE HCL 5 MG PO TABS: 5.0000 mg | ORAL_TABLET | ORAL | Status: DC | PRN

## 2022-09-30 MED ORDER — LISINOPRIL 20 MG PO TABS
20.0000 mg | ORAL_TABLET | Freq: Every morning | ORAL | Status: DC
  Filled 2022-09-30: qty 1

## 2022-09-30 MED ORDER — CHLORHEXIDINE GLUCONATE 0.12 % MT SOLN: 15.0000 mL | Freq: Once | OROMUCOSAL | Status: AC

## 2022-09-30 SURGICAL SUPPLY — 51 items
BAG COUNTER SPONGE SURGICOUNT (BAG) ×1 IMPLANT
BAND RUBBER #18 3X1/16 STRL (MISCELLANEOUS) ×2 IMPLANT
BENZOIN TINCTURE PRP APPL 2/3 (GAUZE/BANDAGES/DRESSINGS) IMPLANT
BLADE CLIPPER SURG (BLADE) IMPLANT
BLADE SURG 11 STRL SS (BLADE) ×1 IMPLANT
BUR MATCHSTICK NEURO 3.0 LAGG (BURR) ×1 IMPLANT
CANISTER SUCT 3000ML PPV (MISCELLANEOUS) ×1 IMPLANT
DERMABOND ADVANCED .7 DNX12 (GAUZE/BANDAGES/DRESSINGS) ×1 IMPLANT
DERMABOND ADVANCED .7 DNX6 (GAUZE/BANDAGES/DRESSINGS) IMPLANT
DRAPE C-ARM 42X72 X-RAY (DRAPES) ×2 IMPLANT
DRAPE HALF SHEET 40X57 (DRAPES) IMPLANT
DRAPE LAPAROTOMY 100X72 PEDS (DRAPES) ×1 IMPLANT
DRAPE MICROSCOPE SLANT 54X150 (MISCELLANEOUS) ×1 IMPLANT
DURAPREP 6ML APPLICATOR 50/CS (WOUND CARE) ×1 IMPLANT
ELECT COATED BLADE 2.86 ST (ELECTRODE) ×1 IMPLANT
ELECT REM PT RETURN 9FT ADLT (ELECTROSURGICAL) ×1
ELECTRODE REM PT RTRN 9FT ADLT (ELECTROSURGICAL) ×1 IMPLANT
GAUZE 4X4 16PLY ~~LOC~~+RFID DBL (SPONGE) IMPLANT
GLOVE BIOGEL PI IND STRL 7.5 (GLOVE) ×2 IMPLANT
GLOVE ECLIPSE 7.5 STRL STRAW (GLOVE) ×1 IMPLANT
GLOVE EXAM NITRILE LRG STRL (GLOVE) IMPLANT
GLOVE EXAM NITRILE XL STR (GLOVE) IMPLANT
GLOVE EXAM NITRILE XS STR PU (GLOVE) IMPLANT
GOWN STRL REUS W/ TWL LRG LVL3 (GOWN DISPOSABLE) ×2 IMPLANT
GOWN STRL REUS W/ TWL XL LVL3 (GOWN DISPOSABLE) IMPLANT
GOWN STRL REUS W/TWL 2XL LVL3 (GOWN DISPOSABLE) IMPLANT
GOWN STRL REUS W/TWL LRG LVL3 (GOWN DISPOSABLE) ×1
GOWN STRL REUS W/TWL XL LVL3 (GOWN DISPOSABLE) ×1
HEMOSTAT POWDER KIT SURGIFOAM (HEMOSTASIS) ×1 IMPLANT
KIT BASIN OR (CUSTOM PROCEDURE TRAY) ×1 IMPLANT
KIT TURNOVER KIT B (KITS) ×1 IMPLANT
NDL SPNL 18GX3.5 QUINCKE PK (NEEDLE) ×1 IMPLANT
NEEDLE HYPO 22GX1.5 SAFETY (NEEDLE) ×1 IMPLANT
NEEDLE SPNL 18GX3.5 QUINCKE PK (NEEDLE) ×1 IMPLANT
NS IRRIG 1000ML POUR BTL (IV SOLUTION) ×1 IMPLANT
PACK LAMINECTOMY NEURO (CUSTOM PROCEDURE TRAY) ×1 IMPLANT
PAD ARMBOARD 7.5X6 YLW CONV (MISCELLANEOUS) ×3 IMPLANT
PIN DISTRACTION 14MM (PIN) IMPLANT
PLATE ANT CERV ATL ELITE 57.5 (Plate) IMPLANT
SCREW SELF TAP VAR 4.0X13 (Screw) IMPLANT
SPACER BONE CORNERSTONE 7X14 (Orthopedic Implant) IMPLANT
SPIKE FLUID TRANSFER (MISCELLANEOUS) ×1 IMPLANT
SPONGE INTESTINAL PEANUT (DISPOSABLE) ×1 IMPLANT
STAPLER VISISTAT 35W (STAPLE) IMPLANT
SUT MNCRL AB 3-0 PS2 18 (SUTURE) ×1 IMPLANT
SUT VIC AB 3-0 SH 8-18 (SUTURE) ×1 IMPLANT
TAPE CLOTH 3X10 TAN LF (GAUZE/BANDAGES/DRESSINGS) ×1 IMPLANT
TOWEL GREEN STERILE (TOWEL DISPOSABLE) ×1 IMPLANT
TOWEL GREEN STERILE FF (TOWEL DISPOSABLE) ×1 IMPLANT
TRAY FOLEY MTR SLVR 16FR STAT (SET/KITS/TRAYS/PACK) ×1 IMPLANT
WATER STERILE IRR 1000ML POUR (IV SOLUTION) ×1 IMPLANT

## 2022-09-30 NOTE — Op Note (Signed)
PATIENT: [name]  PROCEDURE DATE: 09/30/22  PRE-OPERATIVE DIAGNOSIS:  Cervical myelopathy   POST-OPERATIVE DIAGNOSIS:  Same   PROCEDURE:  C4-C5, C5-C6, C6-C7 Anterior Cervical Discectomy and Instrumented Fusion   SURGEON:  Surgeon(s) and Role:    Judith Part, MD - Primary    Duffy Rhody, MD - Assisting   ANESTHESIA: ETGA   BRIEF HISTORY: This is a 61 year old woman who presented with bilateral upper and lower extremity symptoms and signs of cervical myelopathy, MRI showed multi-level stenosis with cord signal change, I therefore recommended ACDF at those levels. This was discussed with the patient as well as risks, benefits, and alternatives and the patient wished to proceed with surgical treatment.   OPERATIVE DETAIL: The patient was taken to the operating room and placed on the OR table in the supine position. A formal time out was performed with two patient identifiers and confirmed the operative site. Anesthesia was induced by the anesthesia team.  Fluoroscopy was used to localize the surgical level and an incision was marked in a skin crease. The area was then prepped and draped in a sterile fashion. A transverse linear incision was made on the right side of the neck. The platysma was divided and the sternocleidomastoid muscle was identified. The carotid sheath was palpated, identified, and retracted laterally with the sternocleidomastoid muscle. The strap muscles were identified and retracted medially and the pretracheal fascia was entered. A bent spinal needle was used with fluoroscopy to localize the surgical level after dissection. The longus colli were elevated bilaterally and a self-retaining retractor was placed. The endotracheal tube cuff balloon was deflated and reinflated after retractor placement.   Anterior osteophytes were removed until flush with the anterior vertebral body. The disc annulus was incised and a complete C4-C5 discectomy was performed. The  posterior longitudinal ligament was incised followed by ligamentous and bony removal until no central canal stenosis was present. Decompression was then taken out laterally into the bilateral foramina until no foraminal stenosis was palpable. A cortical allograft (Medtronic) was inserted into the disc space as an interbody graft.   Retractors were moved and the above technique was then repeated at C5-6 and C6-7 in the same fashion.  An anterior plate (Medtronic) was positioned and 12m screws were used to secure the plate to the C4, C5, C6 and C7 vertebral bodies. Hemostasis was obtained and the incision was closed in layers. All instrument and sponge counts were correct. The patient was then returned to anesthesia for emergence. No apparent complications at the completion of the procedure.   EBL:  537m  DRAINS: none   SPECIMENS: none   ThJudith PartMD 09/30/22 10:53 AM

## 2022-09-30 NOTE — H&P (Signed)
Surgical H&P Update  HPI: 61 y.o. with a history of bladder ca that presented with BUE/BLE symptoms of myelopathy. Workup showed cervical stenosis with cord signal change. No changes in health since they were last seen. Still having the above and wishes to proceed with surgery.  PMHx:  Past Medical History:  Diagnosis Date   Asthma    Cancer ([place]) 07/2022   GERD (gastroesophageal reflux disease)    no longer suffering from   Hyperlipidemia    Hypertension    IFG (impaired fasting glucose)    Obesity    Peripheral vertigo    Thyroid disease    FamHx:  Family History  Problem Relation Age of Onset   Cancer Mother        colon   Stroke Mother    Heart disease Father    Heart disease Maternal Grandmother    Cancer Maternal Grandfather        colon   Cancer Son        Leukemia   SocHx:  reports that she quit smoking about 31 years ago. Her smoking use included cigarettes. She has never used smokeless tobacco. She reports that she does not drink alcohol and does not use drugs.  Physical Exam: Strength 5/5 x4 and SILTx4 except bilateral stocking numbness in hands > feet, +hoffman's b/l  Assesment/Plan: 61 y.o. woman with cervical myelopathy, here for C4-5/5-6/6-7 ACDF. Risks, benefits, and alternatives discussed and the patient would like to continue with surgery.  -OR today -3C post-op  Judith Part, MD 09/30/22 10:44 AM

## 2022-09-30 NOTE — Anesthesia Postprocedure Evaluation (Signed)
Anesthesia Post Note  Patient: [name]  Procedure(s) Performed: CERVICAL FOUR-FIVE, CERVICAL FIVE-SIX, CERVICAL SIX-SEVEN ANTERIOR CERVICAL DECOMPRESSION/DISCECTOMY FUSION (Spine Cervical)     Patient location during evaluation: PACU Anesthesia Type: General Level of consciousness: awake and alert, patient cooperative and oriented Pain management: pain level controlled Vital Signs Assessment: post-procedure vital signs reviewed and stable Respiratory status: spontaneous breathing, nonlabored ventilation and respiratory function stable Cardiovascular status: blood pressure returned to baseline and stable Postop Assessment: no apparent nausea or vomiting and adequate PO intake Anesthetic complications: no   No notable events documented.  Last Vitals:  Vitals:   09/30/22 1520 09/30/22 1550  BP: 127/71 124/73  Pulse: 80 79  Resp: 18 20  Temp: 36.7 C   SpO2: 90% 92%    Last Pain:  Vitals:   09/30/22 1520  TempSrc:   PainSc: 0-No pain                 Tashauna Caisse,E. Slayter Moorhouse

## 2022-09-30 NOTE — Transfer of Care (Signed)
Immediate Anesthesia Transfer of Care Note  Patient: [name]  Procedure(s) Performed: CERVICAL FOUR-FIVE, CERVICAL FIVE-SIX, CERVICAL SIX-SEVEN ANTERIOR CERVICAL DECOMPRESSION/DISCECTOMY FUSION (Spine Cervical)  Patient Location: PACU  Anesthesia Type:General  Level of Consciousness: awake, alert  and oriented  Airway & Oxygen Therapy: Patient connected to face mask oxygen  Post-op Assessment: Post -op Vital signs reviewed and stable  Post vital signs: stable  Last Vitals:  Vitals Value Taken Time  BP 139/82 09/30/22 1420  Temp    Pulse 93 09/30/22 1424  Resp 15 09/30/22 1424  SpO2 97 % 09/30/22 1424  Vitals shown include unvalidated device data.  Last Pain:  Vitals:   09/30/22 0857  TempSrc:   PainSc: 7       Patients Stated Pain Goal: 3 (03/55/97 4163)  Complications: No notable events documented.

## 2022-09-30 NOTE — Anesthesia Procedure Notes (Signed)
Procedure Name: Intubation Date/Time: 09/30/2022 11:00 AM  Performed by: Lavell Luster, CRNAPre-anesthesia Checklist: Patient identified, Emergency Drugs available, Suction available, Patient being monitored and Timeout performed Patient Re-evaluated:Patient Re-evaluated prior to induction Oxygen Delivery Method: Circle system utilized Preoxygenation: Pre-oxygenation with 100% oxygen Induction Type: IV induction Ventilation: Mask ventilation without difficulty Laryngoscope Size: Mac, 3 and Glidescope Tube size: 7.5 mm Number of attempts: 1 Airway Equipment and Method: Stylet and Video-laryngoscopy Placement Confirmation: ETT inserted through vocal cords under direct vision, positive ETCO2 and breath sounds checked- equal and bilateral Secured at: 21 cm Tube secured with: Tape Dental Injury: Teeth and Oropharynx as per pre-operative assessment

## 2022-10-01 DIAGNOSIS — Z87891 Personal history of nicotine dependence: Secondary | ICD-10-CM | POA: Diagnosis not present

## 2022-10-01 DIAGNOSIS — J45909 Unspecified asthma, uncomplicated: Secondary | ICD-10-CM | POA: Diagnosis not present

## 2022-10-01 DIAGNOSIS — M50021 Cervical disc disorder at C4-C5 level with myelopathy: Secondary | ICD-10-CM | POA: Diagnosis not present

## 2022-10-01 DIAGNOSIS — M50023 Cervical disc disorder at C6-C7 level with myelopathy: Secondary | ICD-10-CM | POA: Diagnosis not present

## 2022-10-01 DIAGNOSIS — I10 Essential (primary) hypertension: Secondary | ICD-10-CM | POA: Diagnosis not present

## 2022-10-01 DIAGNOSIS — M50022 Cervical disc disorder at C5-C6 level with myelopathy: Secondary | ICD-10-CM | POA: Diagnosis not present

## 2022-10-01 MED ORDER — CYCLOBENZAPRINE HCL 10 MG PO TABS: 10.0000 mg | ORAL_TABLET | Freq: Three times a day (TID) | ORAL | 2 refills | Status: DC | PRN

## 2022-10-01 MED ORDER — OXYCODONE HCL 5 MG PO TABS: 5.0000 mg | ORAL_TABLET | ORAL | 0 refills | Status: DC | PRN

## 2022-10-01 NOTE — Plan of Care (Signed)
  Problem: Skin Integrity: Goal: Risk for impaired skin integrity will decrease Outcome: Completed/Met   Problem: Education: Goal: Ability to verbalize activity precautions or restrictions will improve Outcome: Completed/Met Goal: Knowledge of the prescribed therapeutic regimen will improve Outcome: Completed/Met Goal: Understanding of discharge needs will improve Outcome: Completed/Met   Problem: Activity: Goal: Ability to avoid complications of mobility impairment will improve Outcome: Completed/Met Goal: Ability to tolerate increased activity will improve Outcome: Completed/Met Goal: Will remain free from falls Outcome: Completed/Met   Problem: Bowel/Gastric: Goal: Gastrointestinal status for postoperative course will improve Outcome: Completed/Met   Problem: Clinical Measurements: Goal: Ability to maintain clinical measurements within normal limits will improve Outcome: Completed/Met Goal: Postoperative complications will be avoided or minimized Outcome: Completed/Met Goal: Diagnostic test results will improve Outcome: Completed/Met   Problem: Pain Management: Goal: Pain level will decrease Outcome: Completed/Met   Problem: Skin Integrity: Goal: Will show signs of wound healing Outcome: Completed/Met   Problem: Health Behavior/Discharge Planning: Goal: Identification of resources available to assist in meeting health care needs will improve Outcome: Completed/Met   Problem: Bladder/Genitourinary: Goal: Urinary functional status for postoperative course will improve Outcome: Completed/Met   

## 2022-10-01 NOTE — Evaluation (Signed)
Occupational Therapy Evaluation Patient Details Name: [name] MRN: [id number] DOB: 01-19-1961 Today's Date: 10/01/2022   History of Present Illness 61 yo s/p 10/10 ACDF C4-5 5-6 6-7 PMH CA, HLD, HTN, IFG, obesity, peripheral vertigo, bladder CA   Clinical Impression   Patient is s/p ACDF surgery resulting in functional limitations due to the deficits listed below (see OT problem list). Pt currently with mild balance deficits with near falls at home. Pt describes furniture walking method for safety. Pt has bil hand numbness but denies it in the forearm. Pt is able to pad to pad with finger tips.  Patient will benefit from skilled OT acutely to increase independence and safety with ADLS to allow discharge MD to recommend as pt progressing.       Recommendations for follow up therapy are one component of a multi-disciplinary discharge planning process, led by the attending physician.  Recommendations may be updated based on patient status, additional functional criteria and insurance authorization.   Follow Up Recommendations  Follow physician's recommendations for discharge plan and follow up therapies (could benefit from hand therapy possibly in the futture)    Assistance Recommended at Discharge PRN  Patient can return home with the following Assist for transportation    Functional Status Assessment  Patient has had a recent decline in their functional status and demonstrates the ability to make significant improvements in function in a reasonable and predictable amount of time.  Equipment Recommendations  None recommended by OT    Recommendations for Other Services       Precautions / Restrictions Precautions Precautions: Fall;Cervical Precaution Comments: handout provided to patient Required Braces or Orthoses:  (no brace at this time)      Mobility Bed Mobility Overal bed mobility: Modified Independent                  Transfers Overall transfer level:  Modified independent                        Balance Overall balance assessment: Mild deficits observed, not formally tested                                         ADL either performed or assessed with clinical judgement   ADL Overall ADL's : Modified independent                                       General ADL Comments: able to figure 4 cross, able to perform sink level grooming. pt with some balance deficits noted and advised to have spouse present for the first few showers for safety. pt with very narrowed walk way in the RV so unlikely to fit a walker easily but is do able. has a recliner at home and plans to sleep in it initially. Pt has help for the dogs. Spouse reports doing all IADLS for the home and pt due to impairments prior to surgery. Pt with pending CHEMo treatments and advised to continue to talk to MD regarding sensation in hands and follow up.     Vision Baseline Vision/History: 1 Wears glasses Patient Visual Report: No change from baseline       Perception     Praxis      Pertinent Vitals/Pain Pain Assessment Pain  Assessment: No/denies pain     Hand Dominance Right   Extremity/Trunk Assessment Upper Extremity Assessment Upper Extremity Assessment: RUE deficits/detail;LUE deficits/detail RUE Deficits / Details: numbness the entire hand with the thumb and 2nd digits being more impaired for sensation changes RUE Sensation: decreased light touch RUE Coordination: decreased fine motor;decreased gross motor (reports no modifications to complete computer level work) LUE Deficits / Details: numbness the entire hand with the thumb and 2nd digits being more impaired for sensation changes LUE Sensation: decreased light touch LUE Coordination: decreased fine motor;decreased gross motor   Lower Extremity Assessment Lower Extremity Assessment: Generalized weakness   Cervical / Trunk Assessment Cervical / Trunk Assessment:  Neck Surgery   Communication Communication Communication: No difficulties   Cognition Arousal/Alertness: Awake/alert Behavior During Therapy: WFL for tasks assessed/performed Overall Cognitive Status: Within Functional Limits for tasks assessed                                       General Comments  pt with LOB with change of direction requiring min (A). communicated with PT to see patient for balance. Pt with dry intact dressing at this time    Exercises Exercises: Other exercises (fine motor exercises) Other Exercises Other Exercises: educated on fine motor hand out and progression to help communicate to MD needs   Shoulder Instructions      Home Living Family/patient expects to be discharged to:: Private residence Living Arrangements: Spouse/significant other Available Help at Discharge: Family;Available PRN/intermittently Type of Home: Other(Comment) (RV / camper) Home Access: Stairs to enter Entrance Stairs-Number of Steps: 3 Entrance Stairs-Rails: Right Home Layout: One level     Bathroom Shower/Tub: Occupational psychologist: Standard     Home Equipment: None   Additional Comments: 2 yorkies. works from home. spouse owns his own business and can be available 24/7 if needed. Spouse expressed plans to go to work tomorrow at [employer] time      Prior Functioning/Environment Prior Level of Function : Independent/Modified Independent;Working/employed             Mobility Comments: reports no falls but near falls when asked multiple. pt reports i grab a hold ADLs Comments: indep.        OT Problem List: Impaired balance (sitting and/or standing);Impaired sensation;Impaired UE functional use      OT Treatment/Interventions: Self-care/ADL training;Therapeutic activities;Manual therapy;Neuromuscular education;Therapeutic exercise;Balance training;Patient/family education    OT Goals(Current goals can be found in the care plan section) Acute  Rehab OT Goals Patient Stated Goal: to get to the onologist and get all this taken care of OT Goal Formulation: With patient Time For Goal Achievement: 10/15/22 Potential to Achieve Goals: Good  OT Frequency: Min 2X/week    Co-evaluation              AM-PAC OT "6 Clicks" Daily Activity     Outcome Measure Help from another person eating meals?: None Help from another person taking care of personal grooming?: None Help from another person toileting, which includes using toliet, bedpan, or urinal?: None Help from another person bathing (including washing, rinsing, drying)?: None Help from another person to put on and taking off regular upper body clothing?: None Help from another person to put on and taking off regular lower body clothing?: None 6 Click Score: 24   End of Session Nurse Communication: Mobility status;Precautions  Activity Tolerance: Patient tolerated treatment well Patient  left: in chair;with call bell/phone within reach;with chair alarm set;with family/visitor present  OT Visit Diagnosis: Unsteadiness on feet (R26.81)                Time: 8347-5830 OT Time Calculation (min): 40 min Charges:  OT General Charges $OT Visit: 1 Visit OT Evaluation $OT Eval Moderate Complexity: 1 Mod OT Treatments $Self Care/Home Management : 23-37 mins   [name], OTR/L  Acute Rehabilitation Services Office: [phone] .   [name] 10/01/2022, 11:27 AM

## 2022-10-01 NOTE — Discharge Summary (Signed)
Discharge Summary  Date of Admission: 09/30/2022  Date of Discharge: 10/01/22  Attending Physician: Emelda Brothers, MD  Hospital Course: Patient was admitted following an uncomplicated 3 level ACDF. They were recovered in PACU and transferred to [facility]. Their preop symptoms were improved, their hospital course was uncomplicated and the patient was discharged home on 10/01/22. They will follow up in clinic with me in clinic in 2 weeks.  Neurologic exam at discharge:  Strength 5/5 x4 and SILTx4 except baseline b/l stocking glove numbness and hoffman's on R, absent on L (improved)  Discharge diagnosis: Cervical myelopathy  Judith Part, MD 10/01/22 7:20 AM

## 2022-10-01 NOTE — Progress Notes (Signed)
Patient alert and oriented, mae's well, voiding adequate amount of urine, swallowing without difficulty, no c/o pain at time of discharge. Patient discharged home with family. Script and discharged instructions given to patient. Patient and family stated understanding of instructions given. Patient has an appointment with Dr. Ostergard   

## 2022-10-01 NOTE — Progress Notes (Signed)
Neurosurgery Service Progress Note  Subjective: No acute events overnight, neck pain actually better than average, no radicular pain, some improvement in hand numbness, big improvement in gait / balance   Objective: Vitals:   09/30/22 1609 09/30/22 2016 09/30/22 2342 10/01/22 0435  BP: (!) 141/81 114/75 102/70 108/73  Pulse: 78 75 68 73  Resp: '18 18 18 18  '$ Temp: 97.7 F (36.5 C) 98 F (36.7 C) 97.8 F (36.6 C) 97.6 F (36.4 C)  TempSrc: Oral Oral Oral Oral  SpO2: 95% 94% 95% 94%  Weight:      Height:        Physical Exam: Strength 5/5 x4 and SILTx4 except b/l stocking glove numbness, +hoffman's on R, absent on L, neck incision c/d/I and soft  Assessment & Plan: 61 y.o. woman s/p 3 level ACDF for myelopathy, recovering well.  -discharge home today  Judith Part  10/01/22 7:16 AM

## 2022-10-01 NOTE — Progress Notes (Signed)
PT Cancellation Note  Patient Details Name: CHANCE KARAM MRN: [id number] DOB: 01/29/1961   Cancelled Treatment:    Reason Eval/Treat Not Completed: (P) Other (comment) OT recommended PT Evaluation however pt discharged prior to completion.  Dalana Pfahler B. Migdalia Dk PT, DPT Acute Rehabilitation Services Please use secure chat or  Call Office [phone]    [place] 10/01/2022, 11:11 AM

## 2022-10-03 ENCOUNTER — Ambulatory Visit (INDEPENDENT_AMBULATORY_CARE_PROVIDER_SITE_OTHER): Payer: 59 | Admitting: Urology

## 2022-10-03 VITALS — BP 152/84 | HR 93

## 2022-10-03 DIAGNOSIS — C672 Malignant neoplasm of lateral wall of bladder: Secondary | ICD-10-CM

## 2022-10-03 DIAGNOSIS — D494 Neoplasm of unspecified behavior of bladder: Secondary | ICD-10-CM

## 2022-10-03 LAB — BLADDER SCAN AMB NON-IMAGING: Scan Result: 0

## 2022-10-03 NOTE — Progress Notes (Unsigned)
post void residual=0 ?

## 2022-10-03 NOTE — Progress Notes (Unsigned)
10/03/2022 9:26 AM   [name] 12-06-1961 [id number]  Referring provider: Jake Samples, PA-C [address] [place],  [place] [address]  No chief complaint on file.   HPI:    PMH: Past Medical History:  Diagnosis Date   Asthma    Cancer ([place]) 07/2022   GERD (gastroesophageal reflux disease)    no longer suffering from   Hyperlipidemia    Hypertension    IFG (impaired fasting glucose)    Obesity    Peripheral vertigo    Thyroid disease     Surgical History: Past Surgical History:  Procedure Laterality Date   ABDOMINAL HYSTERECTOMY     APPENDECTOMY     CYSTOSCOPY W/ RETROGRADES Bilateral 08/26/2022   Procedure: CYSTOSCOPY WITH RETROGRADE PYELOGRAM;  Surgeon: Cleon Gustin, MD;  Location: [facility];  Service: Urology;  Laterality: Bilateral;   TRANSURETHRAL RESECTION OF BLADDER TUMOR N/A 08/26/2022   Procedure: TRANSURETHRAL RESECTION OF BLADDER TUMOR (TURBT);  Surgeon: Cleon Gustin, MD;  Location: [facility];  Service: Urology;  Laterality: N/A;    Home Medications:  Allergies as of 10/03/2022       Reactions   Augmentin [amoxicillin-pot Clavulanate] Diarrhea, Nausea And Vomiting   Pt decreased dose to QD HS and tolerated well        Medication List        Accurate as of October 03, 2022  9:26 AM. If you have any questions, ask your nurse or doctor.          albuterol 108 (90 Base) MCG/ACT inhaler Commonly known as: VENTOLIN HFA Inhale 1-2 puffs into the lungs every 6 (six) hours as needed for wheezing or shortness of breath.   cyclobenzaprine 10 MG tablet Commonly known as: FLEXERIL Take 1 tablet (10 mg total) by mouth 3 (three) times daily as needed for muscle spasms.   gabapentin 300 MG capsule Commonly known as: NEURONTIN Take 300 mg by mouth at bedtime.   hydrochlorothiazide 25 MG tablet Commonly known as: HYDRODIURIL Take 25 mg by mouth 2 (two) times a week.   lisinopril 20 MG tablet Commonly known as:  ZESTRIL Take 20 mg by mouth in the morning.   oxyCODONE 5 MG immediate release tablet Commonly known as: Oxy IR/ROXICODONE Take 1 tablet (5 mg total) by mouth every 4 (four) hours as needed (pain).        Allergies:  Allergies  Allergen Reactions   Augmentin [Amoxicillin-Pot Clavulanate] Diarrhea and Nausea And Vomiting    Pt decreased dose to QD HS and tolerated well    Family History: Family History  Problem Relation Age of Onset   Cancer Mother        colon   Stroke Mother    Heart disease Father    Heart disease Maternal Grandmother    Cancer Maternal Grandfather        colon   Cancer Son        Leukemia    Social History:  reports that she quit smoking about 31 years ago. Her smoking use included cigarettes. She has never used smokeless tobacco. She reports that she does not drink alcohol and does not use drugs.  ROS: All other review of systems were reviewed and are negative except what is noted above in HPI  Physical Exam: BP (!) 152/84   Pulse 93   LMP 02/05/1991 (Approximate)   Constitutional:  Alert and oriented, No acute distress. HEENT: [place] AT, moist mucus membranes.  Trachea midline, no masses.  Cardiovascular: No clubbing, cyanosis, or edema. Respiratory: Normal respiratory effort, no increased work of breathing. GI: Abdomen is soft, nontender, nondistended, no abdominal masses GU: No CVA tenderness.  Lymph: No cervical or inguinal lymphadenopathy. Skin: No rashes, bruises or suspicious lesions. Neurologic: Grossly intact, no focal deficits, moving all 4 extremities. Psychiatric: Normal mood and affect.  Laboratory Data: Lab Results  Component Value Date   WBC 4.8 09/24/2022   HGB 12.9 09/24/2022   HCT 40.2 09/24/2022   MCV 94.6 09/24/2022   PLT 156 09/24/2022    Lab Results  Component Value Date   CREATININE 1.25 (H) 09/24/2022    No results found for: "PSA"  No results found for: "TESTOSTERONE"  No results found for:  "HGBA1C"  Urinalysis    Component Value Date/Time   APPEARANCEUR Cloudy (A) 08/18/2022 1045   GLUCOSEU Negative 08/18/2022 1045   BILIRUBINUR Negative 08/18/2022 1045   PROTEINUR 3+ (A) 08/18/2022 1045   NITRITE Negative 08/18/2022 1045   LEUKOCYTESUR 1+ (A) 08/18/2022 1045    Lab Results  Component Value Date   LABMICR Comment 08/18/2022   WBCUA 0-5 07/15/2022   RBCUA 0-2 12/03/2018   LABEPIT 0-10 07/15/2022   MUCUS Present 07/15/2022   BACTERIA Moderate (A) 07/15/2022    Pertinent Imaging: *** No results found for this or any previous visit.  No results found for this or any previous visit.  No results found for this or any previous visit.  No results found for this or any previous visit.  No results found for this or any previous visit.  No valid procedures specified. Results for orders placed in visit on 07/15/22  CT HEMATURIA WORKUP  Narrative CLINICAL DATA:  Gross hematuria, recurrent UTIs  EXAM: CT ABDOMEN AND PELVIS WITHOUT AND WITH CONTRAST  TECHNIQUE: Multidetector CT imaging of the abdomen and pelvis was performed following the standard protocol before and following the bolus administration of intravenous contrast.  RADIATION DOSE REDUCTION: This exam was performed according to the departmental dose-optimization program which includes automated exposure control, adjustment of the mA and/or kV according to patient size and/or use of iterative reconstruction technique.  CONTRAST:  146m OMNIPAQUE IOHEXOL 300 MG/ML  SOLN  COMPARISON:  None Available.  FINDINGS: Lower chest: No acute abnormality.  Hepatobiliary: No solid liver abnormality is seen. No gallstones, gallbladder wall thickening, or biliary dilatation.  Pancreas: Unremarkable. No pancreatic ductal dilatation or surrounding inflammatory changes.  Spleen: Normal in size without significant abnormality.  Adrenals/Urinary Tract: Adrenal glands are unremarkable. Simple, benign renal  cortical cysts of the inferior pole of the right kidney, for which no further follow-up or characterization is required. Kidneys are otherwise normal, without renal calculi, solid lesion, or hydronephrosis. No urinary tract filling defect on delayed phase image. Thickening and hyperenhancement of the urinary bladder wall.  Stomach/Bowel: Stomach is within normal limits. Appendix is surgically absent no evidence of bowel wall thickening, distention, or inflammatory changes. Sigmoid diverticula.  Vascular/Lymphatic: Aortic atherosclerosis. No enlarged abdominal or pelvic lymph nodes.  Reproductive: Status post hysterectomy. Numerous surgical clips about the low pelvis.  Other: No abdominal wall hernia or abnormality. Small volume free fluid in the low pelvis (series 7, image 71).  Musculoskeletal: No acute or significant osseous findings.  IMPRESSION: 1. Thickening and hyperenhancement of the urinary bladder wall, suggestive of nonspecific infectious or inflammatory cystitis. Correlate with urinalysis. 2. No evidence of urinary tract mass, calculus or hydronephrosis. No urinary tract filling defect on delayed phase imaging 3. Nonspecific small volume ascites in  the low pelvis, possibly reactive. 4. Diverticulosis without evidence of acute diverticulitis. 5. Status post hysterectomy.  Aortic Atherosclerosis (ICD10-I70.0).   Electronically Signed By: Delanna Ahmadi M.D. On: 07/21/2022 13:40  No results found for this or any previous visit.   Assessment & Plan:    1. Malignant neoplasm of lateral wall of urinary bladder (HCC) -We will call to confirm appointment with Dr. Tresa Moore at [place]. Patient has appointment with Dr. Alen Blew 10/16.  - BLADDER SCAN AMB NON-IMAGING - POCT urinalysis dipstick - Ambulatory referral to Urology   No follow-ups on file.  Nicolette Bang, MD  [facility] [place]

## 2022-10-06 ENCOUNTER — Inpatient Hospital Stay: Payer: 59 | Attending: Oncology | Admitting: Oncology

## 2022-10-06 DIAGNOSIS — C679 Malignant neoplasm of bladder, unspecified: Secondary | ICD-10-CM | POA: Diagnosis not present

## 2022-10-06 NOTE — Progress Notes (Signed)
Hematology and Oncology Follow Up for Telemedicine Visits  [name] [id number] [birth year] 61 y.o. 10/06/2022 9:52 AM Daisy Samples, PA-CJackson, Hulen Shouts, PA*   I connected with on [name] 10/06/22 at 10:30 AM EDT by telephone visit and verified that I am speaking with the correct person using two identifiers.   I discussed the limitations, risks, security and privacy concerns of performing an evaluation and management service by telemedicine and the availability of in-person appointments. I also discussed with the patient that there may be a patient responsible charge related to this service. The patient expressed understanding and agreed to proceed.  Other persons participating in the visit and their role in the encounter:    Patient's location: Home Provider's location: Office    Principle Diagnosis: 61 year old woman with T2N0 high-grade urothelial carcinoma of the bladder diagnosed in September 2023.   Prior Therapy: She is status post TURBT on September 5 which showed high-grade poorly differentiated urothelial carcinoma with invasion into the lamina propria and possible muscle invasion.  Current therapy: Under evaluation for possible definitive therapy.  Interim History: [name] reports no major complaints.  She underwent cervical discectomy and fusion on September 30, 2022 and is slowly recovering.  She denies any hematochezia melena or abdominal pain.    Medications: I have reviewed the patient's current medications.  Current Outpatient Medications  Medication Sig Dispense Refill   albuterol (VENTOLIN HFA) 108 (90 Base) MCG/ACT inhaler Inhale 1-2 puffs into the lungs every 6 (six) hours as needed for wheezing or shortness of breath.     cyclobenzaprine (FLEXERIL) 10 MG tablet Take 1 tablet (10 mg total) by mouth 3 (three) times daily as needed for muscle spasms. 30 tablet 2   gabapentin (NEURONTIN) 300 MG capsule Take 300 mg by mouth at bedtime.      hydrochlorothiazide (HYDRODIURIL) 25 MG tablet Take 25 mg by mouth 2 (two) times a week.     lisinopril (ZESTRIL) 20 MG tablet Take 20 mg by mouth in the morning.     oxyCODONE (OXY IR/ROXICODONE) 5 MG immediate release tablet Take 1 tablet (5 mg total) by mouth every 4 (four) hours as needed (pain). 30 tablet 0   No current facility-administered medications for this visit.     Allergies:  Allergies  Allergen Reactions   Augmentin [Amoxicillin-Pot Clavulanate] Diarrhea and Nausea And Vomiting    Pt decreased dose to QD HS and tolerated well        Lab Results: Lab Results  Component Value Date   WBC 4.8 09/24/2022   HGB 12.9 09/24/2022   HCT 40.2 09/24/2022   MCV 94.6 09/24/2022   PLT 156 09/24/2022     Chemistry      Component Value Date/Time   NA 142 09/24/2022 1600   NA 143 07/15/2022 1605   K 3.8 09/24/2022 1600   CL 109 09/24/2022 1600   CO2 28 09/24/2022 1600   BUN 17 09/24/2022 1600   BUN 15 07/15/2022 1605   CREATININE 1.25 (H) 09/24/2022 1600      Component Value Date/Time   CALCIUM 9.2 09/24/2022 1600   ALKPHOS 104 12/03/2018 1610   AST 33 12/03/2018 1610   ALT 41 (H) 12/03/2018 1610   BILITOT 0.4 12/03/2018 1610          Impression and Plan:  61 year old with:   High-grade urothelial carcinoma of the bladder diagnosed in September 2023.  She was found to have T2 disease with muscle invasion.  Her disease status was updated at this time and treatment choices were reviewed.  Given her muscle invasion the standard of care is neoadjuvant chemotherapy followed by radical cystectomy.  She would not be a candidate for cisplatin chemotherapy given her borderline kidney function with a creatinine clearance of 48 cc/min.  Based on these findings have recommended either proceeding with radical cystectomy upfront or definitive therapy with radiation concomitantly with gemcitabine chemotherapy.  She is scheduled to have a evaluation by [name] to discuss  these options.  She elected to proceed with radiation will discussed the role of chemotherapy at that time.   2.  Cervical spinal stenosis: She is status post surgical fixation.   3.  Follow-up: We will be as needed in the future pending her decision how to proceed.      I discussed the assessment and treatment plan with the patient. The patient was provided an opportunity to ask questions and all were answered. The patient agreed with the plan and demonstrated an understanding of the instructions.   The patient was advised to call back or seek an in-person evaluation if the symptoms worsen or if the condition fails to improve as anticipated.  I provided 20 minutes of non face-to-face telephone visit time during this encounter.  The time was spent on reviewing her pathology results, discussing treatment choices and complications related to therapy.  Daisy Button, MD 10/06/2022 9:52 AM

## 2022-10-07 ENCOUNTER — Encounter (HOSPITAL_COMMUNITY): Payer: Self-pay | Admitting: Neurological Surgery

## 2022-10-09 ENCOUNTER — Encounter: Payer: Self-pay | Admitting: Urology

## 2022-10-09 NOTE — Patient Instructions (Signed)

## 2022-10-21 DIAGNOSIS — C678 Malignant neoplasm of overlapping sites of bladder: Secondary | ICD-10-CM | POA: Diagnosis not present

## 2022-10-21 DIAGNOSIS — N183 Chronic kidney disease, stage 3 unspecified: Secondary | ICD-10-CM | POA: Diagnosis not present

## 2022-10-23 ENCOUNTER — Other Ambulatory Visit: Payer: Self-pay | Admitting: Urology

## 2022-11-04 ENCOUNTER — Encounter (HOSPITAL_BASED_OUTPATIENT_CLINIC_OR_DEPARTMENT_OTHER): Payer: Self-pay | Admitting: Urology

## 2022-11-04 NOTE — Progress Notes (Addendum)
Spoke w/ via phone for pre-op interview---[name] needs dos----   ISTAT            Daniel results------ EKG current dated 08/21/22 in Epic. COVID test -----patient states asymptomatic no test needed Arrive at -------1015 NPO after MN NO Solid Food.  Clear liquids from MN until---0915 Med rec completed Medications to take morning of surgery ----- Bring Albuterol inhaler Diabetic medication ----- Patient instructed no nail polish to be worn day of surgery Patient instructed to bring photo id and insurance card day of surgery Patient aware to have Driver (ride ) / caregiver  Husband [name]  for 24 hours after surgery  Patient Special Instructions ----- Pre-Op special Istructions ----- Patient verbalized understanding of instructions that were given at this phone interview. Patient denies shortness of breath, chest pain, fever, cough at this phone interview.

## 2022-11-07 ENCOUNTER — Encounter (HOSPITAL_BASED_OUTPATIENT_CLINIC_OR_DEPARTMENT_OTHER)
Admission: RE | Disposition: A | Discharge status: Home / Self Care | Payer: Self-pay | Source: Home / Self Care | Attending: Urology

## 2022-11-07 ENCOUNTER — Ambulatory Visit (HOSPITAL_BASED_OUTPATIENT_CLINIC_OR_DEPARTMENT_OTHER)
Admission: RE | Admit: 2022-11-07 | Discharge: 2022-11-07 | Disposition: A | Discharge status: Home / Self Care | Payer: 59 | Attending: Urology | Admitting: Urology

## 2022-11-07 ENCOUNTER — Ambulatory Visit (HOSPITAL_BASED_OUTPATIENT_CLINIC_OR_DEPARTMENT_OTHER): Payer: 59 | Admitting: Anesthesiology

## 2022-11-07 ENCOUNTER — Encounter (HOSPITAL_BASED_OUTPATIENT_CLINIC_OR_DEPARTMENT_OTHER): Payer: Self-pay | Admitting: Urology

## 2022-11-07 ENCOUNTER — Other Ambulatory Visit: Payer: Self-pay

## 2022-11-07 DIAGNOSIS — Z8551 Personal history of malignant neoplasm of bladder: Secondary | ICD-10-CM | POA: Diagnosis not present

## 2022-11-07 DIAGNOSIS — Z6826 Body mass index (BMI) 26.0-26.9, adult: Secondary | ICD-10-CM | POA: Insufficient documentation

## 2022-11-07 DIAGNOSIS — E669 Obesity, unspecified: Secondary | ICD-10-CM | POA: Insufficient documentation

## 2022-11-07 DIAGNOSIS — I10 Essential (primary) hypertension: Secondary | ICD-10-CM

## 2022-11-07 DIAGNOSIS — C679 Malignant neoplasm of bladder, unspecified: Secondary | ICD-10-CM | POA: Diagnosis not present

## 2022-11-07 DIAGNOSIS — Z87891 Personal history of nicotine dependence: Secondary | ICD-10-CM | POA: Insufficient documentation

## 2022-11-07 DIAGNOSIS — J4489 Other specified chronic obstructive pulmonary disease: Secondary | ICD-10-CM | POA: Insufficient documentation

## 2022-11-07 DIAGNOSIS — N3020 Other chronic cystitis without hematuria: Secondary | ICD-10-CM | POA: Diagnosis not present

## 2022-11-07 DIAGNOSIS — Z923 Personal history of irradiation: Secondary | ICD-10-CM | POA: Insufficient documentation

## 2022-11-07 DIAGNOSIS — J449 Chronic obstructive pulmonary disease, unspecified: Secondary | ICD-10-CM

## 2022-11-07 DIAGNOSIS — Z8541 Personal history of malignant neoplasm of cervix uteri: Secondary | ICD-10-CM | POA: Diagnosis not present

## 2022-11-07 DIAGNOSIS — D090 Carcinoma in situ of bladder: Secondary | ICD-10-CM | POA: Diagnosis not present

## 2022-11-07 HISTORY — PX: CYSTOSCOPY W/ RETROGRADES: SHX1426

## 2022-11-07 HISTORY — PX: TRANSURETHRAL RESECTION OF BLADDER TUMOR: SHX2575

## 2022-11-07 LAB — POCT I-STAT, CHEM 8
BUN: 15 mg/dL (ref 8–23)
Calcium, Ion: 1.2 mmol/L (ref 1.15–1.40)
Chloride: 106 mmol/L (ref 98–111)
Creatinine, Ser: 0.8 mg/dL (ref 0.44–1.00)
Glucose, Bld: 92 mg/dL (ref 70–99)
HCT: 38 % (ref 36.0–46.0)
Hemoglobin: 12.9 g/dL (ref 12.0–15.0)
Potassium: 3.5 mmol/L (ref 3.5–5.1)
Sodium: 141 mmol/L (ref 135–145)
TCO2: 26 mmol/L (ref 22–32)

## 2022-11-07 SURGERY — TURBT (TRANSURETHRAL RESECTION OF BLADDER TUMOR)
Anesthesia: General | Site: Pelvis

## 2022-11-07 MED ORDER — EPHEDRINE SULFATE (PRESSORS) 50 MG/ML IJ SOLN
INTRAMUSCULAR | Status: DC | PRN
  Administered 2022-11-07 (×2): 10 mg via INTRAVENOUS

## 2022-11-07 MED ORDER — IOHEXOL 300 MG/ML  SOLN
INTRAMUSCULAR | Status: DC | PRN
  Administered 2022-11-07: 20 mL

## 2022-11-07 MED ORDER — OXYCODONE HCL 5 MG PO TABS: 5.0000 mg | ORAL_TABLET | ORAL | 0 refills | Status: DC | PRN

## 2022-11-07 MED ORDER — MIDAZOLAM HCL 2 MG/2ML IJ SOLN
INTRAMUSCULAR | Status: AC
  Filled 2022-11-07: qty 2

## 2022-11-07 MED ORDER — FENTANYL CITRATE (PF) 100 MCG/2ML IJ SOLN
INTRAMUSCULAR | Status: DC | PRN
  Administered 2022-11-07: 50 ug via INTRAVENOUS

## 2022-11-07 MED ORDER — PHENYLEPHRINE 80 MCG/ML (10ML) SYRINGE FOR IV PUSH (FOR BLOOD PRESSURE SUPPORT)
PREFILLED_SYRINGE | INTRAVENOUS | Status: DC | PRN
  Administered 2022-11-07 (×4): 160 ug via INTRAVENOUS

## 2022-11-07 MED ORDER — MIDAZOLAM HCL 5 MG/5ML IJ SOLN
INTRAMUSCULAR | Status: DC | PRN
  Administered 2022-11-07: 2 mg via INTRAVENOUS

## 2022-11-07 MED ORDER — LIDOCAINE HCL (CARDIAC) PF 100 MG/5ML IV SOSY
PREFILLED_SYRINGE | INTRAVENOUS | Status: DC | PRN
  Administered 2022-11-07: 60 mg via INTRAVENOUS
  Administered 2022-11-07: 40 mg via INTRAVENOUS

## 2022-11-07 MED ORDER — PROPOFOL 10 MG/ML IV BOLUS
INTRAVENOUS | Status: DC | PRN
  Administered 2022-11-07: 150 mg via INTRAVENOUS

## 2022-11-07 MED ORDER — DEXAMETHASONE SODIUM PHOSPHATE 4 MG/ML IJ SOLN
INTRAMUSCULAR | Status: DC | PRN
  Administered 2022-11-07: 5 mg via INTRAVENOUS

## 2022-11-07 MED ORDER — LIDOCAINE HCL (PF) 2 % IJ SOLN
INTRAMUSCULAR | Status: AC
  Filled 2022-11-07: qty 5

## 2022-11-07 MED ORDER — LACTATED RINGERS IV SOLN
INTRAVENOUS | Status: DC
  Administered 2022-11-07: 1000 mL via INTRAVENOUS

## 2022-11-07 MED ORDER — PROPOFOL 10 MG/ML IV BOLUS
INTRAVENOUS | Status: AC
  Filled 2022-11-07: qty 20

## 2022-11-07 MED ORDER — DEXAMETHASONE SODIUM PHOSPHATE 10 MG/ML IJ SOLN
INTRAMUSCULAR | Status: AC
  Filled 2022-11-07: qty 1

## 2022-11-07 MED ORDER — ACETAMINOPHEN 10 MG/ML IV SOLN: 1000.0000 mg | Freq: Once | INTRAVENOUS | Status: DC | PRN

## 2022-11-07 MED ORDER — AMISULPRIDE (ANTIEMETIC) 5 MG/2ML IV SOLN: 10.0000 mg | Freq: Once | INTRAVENOUS | Status: DC | PRN

## 2022-11-07 MED ORDER — FENTANYL CITRATE (PF) 100 MCG/2ML IJ SOLN
INTRAMUSCULAR | Status: AC
  Filled 2022-11-07: qty 2

## 2022-11-07 MED ORDER — ONDANSETRON HCL 4 MG/2ML IJ SOLN
INTRAMUSCULAR | Status: DC | PRN
  Administered 2022-11-07: 4 mg via INTRAVENOUS

## 2022-11-07 MED ORDER — PROMETHAZINE HCL 25 MG/ML IJ SOLN: 6.2500 mg | INTRAMUSCULAR | Status: DC | PRN

## 2022-11-07 MED ORDER — ONDANSETRON HCL 4 MG/2ML IJ SOLN
INTRAMUSCULAR | Status: AC
  Filled 2022-11-07: qty 2

## 2022-11-07 MED ORDER — FENTANYL CITRATE (PF) 100 MCG/2ML IJ SOLN: 25.0000 ug | INTRAMUSCULAR | Status: DC | PRN

## 2022-11-07 MED ORDER — PHENYLEPHRINE 80 MCG/ML (10ML) SYRINGE FOR IV PUSH (FOR BLOOD PRESSURE SUPPORT)
PREFILLED_SYRINGE | INTRAVENOUS | Status: AC
  Filled 2022-11-07: qty 10

## 2022-11-07 MED ORDER — ACETAMINOPHEN 160 MG/5ML PO SOLN: 325.0000 mg | ORAL | Status: DC | PRN

## 2022-11-07 MED ORDER — OXYCODONE HCL 5 MG/5ML PO SOLN: 5.0000 mg | Freq: Once | ORAL | Status: DC | PRN

## 2022-11-07 MED ORDER — GENTAMICIN SULFATE 40 MG/ML IJ SOLN
5.0000 mg/kg | INTRAVENOUS | Status: AC
  Administered 2022-11-07: 330 mg via INTRAVENOUS
  Filled 2022-11-07: qty 8.25

## 2022-11-07 MED ORDER — ACETAMINOPHEN 325 MG PO TABS: 325.0000 mg | ORAL_TABLET | ORAL | Status: DC | PRN

## 2022-11-07 MED ORDER — OXYCODONE HCL 5 MG PO TABS: 5.0000 mg | ORAL_TABLET | Freq: Once | ORAL | Status: DC | PRN

## 2022-11-07 MED ORDER — SODIUM CHLORIDE 0.9 % IR SOLN
Status: DC | PRN
  Administered 2022-11-07: 3000 mL
  Administered 2022-11-07: 6000 mL

## 2022-11-07 MED ORDER — EPHEDRINE 5 MG/ML INJ
INTRAVENOUS | Status: AC
  Filled 2022-11-07: qty 5

## 2022-11-07 SURGICAL SUPPLY — 30 items
BAG DRAIN URO-CYSTO SKYTR STRL (DRAIN) ×2 IMPLANT
BAG DRN RND TRDRP ANRFLXCHMBR (UROLOGICAL SUPPLIES)
BAG DRN UROCATH (DRAIN) ×2
BAG URINE DRAIN 2000ML AR STRL (UROLOGICAL SUPPLIES) IMPLANT
BAG URINE LEG 500ML (DRAIN) IMPLANT
CATH FOLEY 2WAY SLVR  5CC 22FR (CATHETERS)
CATH FOLEY 2WAY SLVR 30CC 20FR (CATHETERS) IMPLANT
CATH FOLEY 2WAY SLVR 5CC 22FR (CATHETERS) IMPLANT
CATH URETL OPEN END 6FR 70 (CATHETERS) ×2 IMPLANT
CLOTH BEACON ORANGE TIMEOUT ST (SAFETY) ×2 IMPLANT
DRSG TELFA 3X8 NADH STRL (GAUZE/BANDAGES/DRESSINGS) IMPLANT
ELECT REM PT RETURN 9FT ADLT (ELECTROSURGICAL) ×2
ELECTRODE REM PT RTRN 9FT ADLT (ELECTROSURGICAL) ×2 IMPLANT
EVACUATOR MICROVAS BLADDER (UROLOGICAL SUPPLIES) IMPLANT
GLOVE BIO SURGEON STRL SZ7.5 (GLOVE) ×2 IMPLANT
GOWN STRL REUS W/TWL LRG LVL3 (GOWN DISPOSABLE) ×2 IMPLANT
GUIDEWIRE ANG ZIPWIRE 038X150 (WIRE) IMPLANT
GUIDEWIRE STR DUAL SENSOR (WIRE) IMPLANT
IV NS 1000ML (IV SOLUTION) ×2
IV NS 1000ML BAXH (IV SOLUTION) ×2 IMPLANT
IV NS IRRIG 3000ML ARTHROMATIC (IV SOLUTION) ×2 IMPLANT
KIT TURNOVER CYSTO (KITS) ×2 IMPLANT
LOOP CUT BIPOLAR 24F LRG (ELECTROSURGICAL) IMPLANT
MANIFOLD NEPTUNE II (INSTRUMENTS) ×2 IMPLANT
NS IRRIG 500ML POUR BTL (IV SOLUTION) ×2 IMPLANT
PACK CYSTO (CUSTOM PROCEDURE TRAY) ×2 IMPLANT
SYR 10ML LL (SYRINGE) IMPLANT
SYR TOOMEY IRRIG 70ML (MISCELLANEOUS)
SYRINGE TOOMEY IRRIG 70ML (MISCELLANEOUS) IMPLANT
TUBE CONNECTING 12X1/4 (SUCTIONS) IMPLANT

## 2022-11-07 NOTE — Brief Op Note (Signed)
11/07/2022  12:24 PM  PATIENT:  [name]  61 y.o. female  PRE-OPERATIVE DIAGNOSIS:  BLADDER CANCER  POST-OPERATIVE DIAGNOSIS:  BLADDER CANCER  PROCEDURE:  Procedure(s) with comments: TRANSURETHRAL RESECTION OF BLADDER TUMOR (TURBT) (N/A) - 1 HR CYSTOSCOPY WITH RETROGRADE PYELOGRAM (Bilateral)  SURGEON:  Surgeon(s) and Role:    * Alexis Frock, MD - Primary  PHYSICIAN ASSISTANT:   ASSISTANTS: none   ANESTHESIA:   general  EBL:  5 mL   BLOOD ADMINISTERED:none  DRAINS: none   LOCAL MEDICATIONS USED:  NONE  SPECIMEN:  Source of Specimen:  1 - bladder erythema; 2 - deep biopsy  DISPOSITION OF SPECIMEN:  PATHOLOGY  COUNTS:  YES  TOURNIQUET:  * No tourniquets in log *  DICTATION: .Other Dictation: Dictation Number [id number]  PLAN OF CARE: Discharge to home after PACU  PATIENT DISPOSITION:  PACU - hemodynamically stable.   Delay start of Pharmacological VTE agent (>24hrs) due to surgical blood loss or risk of bleeding: yes

## 2022-11-07 NOTE — Anesthesia Preprocedure Evaluation (Addendum)
Anesthesia Evaluation  Patient identified by MRN, date of birth, ID band Patient awake    Reviewed: Allergy & Precautions, NPO status , Patient's Chart, lab work & pertinent test results  Airway Mallampati: I  TM Distance: >3 FB Neck ROM: Full    Dental  (+) Upper Dentures, Lower Dentures   Pulmonary asthma , COPD,  COPD inhaler, former smoker    + decreased breath sounds      Cardiovascular hypertension, Pt. on medications  Rhythm:Regular Rate:Normal     Neuro/Psych negative neurological ROS  negative psych ROS   GI/Hepatic Neg liver ROS,GERD  ,,  Endo/Other  Hypothyroidism    Renal/GU negative Renal ROS     Musculoskeletal negative musculoskeletal ROS (+)    Abdominal   Peds  Hematology negative hematology ROS (+)   Anesthesia Other Findings   Reproductive/Obstetrics                             Anesthesia Physical Anesthesia Plan  ASA: 3  Anesthesia Plan: General   Post-op Pain Management:    Induction: Intravenous  PONV Risk Score and Plan: 4 or greater and Ondansetron, Dexamethasone, Midazolam and Scopolamine patch - Pre-op  Airway Management Planned: LMA  Additional Equipment: None  Intra-op Plan:   Post-operative Plan: Extubation in OR  Informed Consent: I have reviewed the patients History and Physical, chart, labs and discussed the procedure including the risks, benefits and alternatives for the proposed anesthesia with the patient or authorized representative who has indicated his/her understanding and acceptance.       Plan Discussed with: CRNA  Anesthesia Plan Comments:        Anesthesia Quick Evaluation

## 2022-11-07 NOTE — Transfer of Care (Signed)
Immediate Anesthesia Transfer of Care Note  Patient: [name]  Procedure(s) Performed: TRANSURETHRAL RESECTION OF BLADDER TUMOR (TURBT) (Bladder) CYSTOSCOPY WITH RETROGRADE PYELOGRAM (Bilateral: Pelvis)  Patient Location: PACU  Anesthesia Type:General  Level of Consciousness: awake, alert , and oriented  Airway & Oxygen Therapy: Patient Spontanous Breathing and Patient connected to face mask oxygen  Post-op Assessment: Report given to RN and Post -op Vital signs reviewed and stable  Post vital signs: Reviewed and stable  Last Vitals:  Vitals Value Taken Time  BP 133/72 11/07/22 1241  Temp    Pulse 86 11/07/22 1243  Resp 23 11/07/22 1243  SpO2 100 % 11/07/22 1243  Vitals shown include unvalidated device data.  Last Pain:  Vitals:   11/07/22 1042  TempSrc:   PainSc: 1       Patients Stated Pain Goal: 7 (41/42/39 5320)  Complications: No notable events documented.

## 2022-11-07 NOTE — H&P (Signed)
[name] is an 61 y.o. female.    Chief Complaint: Pre-Op Restaging Transurethral Resection of Bladder Tumor  HPI:   1 - Suspect Muscle Invasive Bladder Cancer - T1/2 high grade urothlial carcinoma by TURBT 08/2022 by Dr. Alyson Daniel. CT localized, singl ureters bilterally. She has h/o TAH/XRT/node dissection for h/o cervical cancer. Remote 20PY smoker. Cr 2's per report and not cisplatinum candidate per medical oncology. Not candidate for signifciant pelvic radiation given already high dose in area. There is some questions on prior path if truly muscle invasive.   PMH sig for mild obesity, COPD (not limiting), C spine fusion. Her husband [name] is very involved. Her PCP is Daisy Cheadle PA   Today "[name]" is seen to proceed with restaging transurethral resection of bladder tumor in effort to maximally rule out muscle invasive disease. No interval fevers.   Past Medical History:  Diagnosis Date   Asthma    Cancer ([place]) 07/2022   GERD (gastroesophageal reflux disease)    no longer suffering from   Hyperlipidemia    Hypertension    IFG (impaired fasting glucose)    Obesity    Peripheral vertigo    Thyroid disease     Past Surgical History:  Procedure Laterality Date   ABDOMINAL HYSTERECTOMY     ANTERIOR CERVICAL DECOMP/DISCECTOMY FUSION N/A 09/30/2022   Procedure: CERVICAL FOUR-FIVE, CERVICAL FIVE-SIX, CERVICAL SIX-SEVEN ANTERIOR CERVICAL DECOMPRESSION/DISCECTOMY FUSION;  Surgeon: Daisy Part, MD;  Location: [facility];  Service: Neurosurgery;  Laterality: N/A;   APPENDECTOMY     CYSTOSCOPY W/ RETROGRADES Bilateral 08/26/2022   Procedure: CYSTOSCOPY WITH RETROGRADE PYELOGRAM;  Surgeon: Daisy Gustin, MD;  Location: [facility];  Service: Urology;  Laterality: Bilateral;   TRANSURETHRAL RESECTION OF BLADDER TUMOR N/A 08/26/2022   Procedure: TRANSURETHRAL RESECTION OF BLADDER TUMOR (TURBT);  Surgeon: Daisy Gustin, MD;  Location: [facility];  Service: Urology;  Laterality: N/A;     Family History  Problem Relation Age of Onset   Cancer Mother        colon   Stroke Mother    Heart disease Father    Heart disease Maternal Grandmother    Cancer Maternal Grandfather        colon   Cancer Son        Leukemia   Social History:  reports that she quit smoking about 31 years ago. Her smoking use included cigarettes. She has never used smokeless tobacco. She reports that she does not drink alcohol and does not use drugs.  Allergies:  Allergies  Allergen Reactions   Augmentin [Amoxicillin-Pot Clavulanate] Diarrhea and Nausea And Vomiting    Pt decreased dose to QD HS and tolerated well    No medications prior to admission.    No results found for this or any previous visit (from the past 48 hour(s)). No results found.  Review of Systems  Constitutional:  Negative for chills and fever.  All other systems reviewed and are negative.   Height '5\' 6"'$  (1.676 m), weight 74.8 kg, last menstrual period 02/05/1991. Physical Exam Vitals reviewed.  HENT:     Head: Normocephalic.     Nose: Nose normal.  Eyes:     Pupils: Pupils are equal, round, and reactive to light.  Cardiovascular:     Rate and Rhythm: Normal rate.  Pulmonary:     Effort: Pulmonary effort is normal.  Abdominal:     General: Abdomen is flat.  Genitourinary:    Comments: No CVAT at present Musculoskeletal:  General: Normal range of motion.     Cervical back: Normal range of motion.  Neurological:     General: No focal deficit present.     Mental Status: She is alert.  Psychiatric:        Mood and Affect: Mood normal.      Assessment/Plan  Proceed as planned with TURBT/Retrogrades. Risks,  benefits, alternatives, expected peri-op course discussed previously and reiterated today.   Daisy Frock, MD 11/07/2022, 7:59 AM

## 2022-11-07 NOTE — Anesthesia Procedure Notes (Signed)
Procedure Name: LMA Insertion Date/Time: 11/07/2022 11:49 AM  Performed by: Rogers Blocker, CRNAPre-anesthesia Checklist: Patient identified, Emergency Drugs available, Suction available and Patient being monitored Patient Re-evaluated:Patient Re-evaluated prior to induction Oxygen Delivery Method: Circle System Utilized Preoxygenation: Pre-oxygenation with 100% oxygen Induction Type: IV induction Ventilation: Mask ventilation without difficulty LMA: LMA inserted LMA Size: 4.0 Number of attempts: 1 Placement Confirmation: positive ETCO2 Tube secured with: Tape Dental Injury: Teeth and Oropharynx as per pre-operative assessment

## 2022-11-07 NOTE — Anesthesia Postprocedure Evaluation (Signed)
Anesthesia Post Note  Patient: [name]  Procedure(s) Performed: TRANSURETHRAL RESECTION OF BLADDER TUMOR (TURBT) (Bladder) CYSTOSCOPY WITH RETROGRADE PYELOGRAM (Bilateral: Pelvis)     Patient location during evaluation: PACU Anesthesia Type: General Level of consciousness: awake and alert Pain management: pain level controlled Vital Signs Assessment: post-procedure vital signs reviewed and stable Respiratory status: spontaneous breathing, nonlabored ventilation, respiratory function stable and patient connected to nasal cannula oxygen Cardiovascular status: blood pressure returned to baseline and stable Postop Assessment: no apparent nausea or vomiting Anesthetic complications: no   No notable events documented.  Last Vitals:  Vitals:   11/07/22 1315 11/07/22 1342  BP: 133/75 133/80  Pulse: 80 79  Resp: 15 16  Temp:  36.7 C  SpO2: 95% 100%    Last Pain:  Vitals:   11/07/22 1342  TempSrc:   PainSc: 0-No pain                 Effie Berkshire

## 2022-11-07 NOTE — Op Note (Signed)
[name], [name] MEDICAL RECORD NO: [id number] ACCOUNT NO: [id number] DATE OF BIRTH: September 24, 1961 FACILITY: [facility] LOCATION: [facility] PHYSICIAN: Alexis Frock, MD  Operative Report   DATE OF PROCEDURE: 11/07/2022  PREOPERATIVE DIAGNOSIS:  History of bladder cancer, questionable muscle invasive.  Also, history of cervical cancer, status post radiation years ago.  PROCEDURE PERFORMED:   1.  Cystoscopy with bilateral retrograde pyelograms interpretation. 2.  Transurethral resection of bladder tumor, volume medium.  ESTIMATED BLOOD LOSS:  10 mL  COMPLICATIONS:  None.  SPECIMENS:   1.  Bladder erythema, history of pelvic radiation. 2.  Biopsy for permanent pathology.  FINDINGS:   1.  Diffuse friable nonpapillary erythema of the majority of the surface of the urinary bladder.  Query carcinoma in situ versus radiation cystitis changes. 2.  A lateral displacement of the ureteral orifices. 3.  Unremarkable bilateral retrograde pyelograms. 4.  Numerous metal clips in the pelvis consistent with known prior hysterectomy and lymph node dissection for cancer.  INDICATIONS:  The patient is a pleasant 61 year old lady with history of cervical cancer years ago, managed with radiation and surgery.  She was found on workup of hematuria to have likely high-grade urothelial carcinoma of the bladder which underwent  transurethral resection at referring institution that revealed a T1 versus very early T2 disease.  She was referred for consideration of cystectomy.  We discussed this path versus a bladder preservation path in detail.  She has understandably very  reluctant to pursue bladder removal at this time.  We both agreed on restaging transurethral resection to verify no rapidly recurrent or residual cancer and hopefully obtain more definitive seromuscular samples to rule out frank muscle invasive disease.   Informed consent was obtained and placed in medical record.  PROCEDURE IN DETAIL:  The  patient being identified and verified, procedure being transurethral resection of bladder tumor, bilateral retrogrades confirmed.  Procedure timeout was performed.  Intravenous antibiotics were administered.  General anesthesia  introduced.  The patient was placed into a low lithotomy position.  Sterile field was created, prepped and draped the patient's vagina, introitus, and proximal thighs using iodine.  Cystourethroscopy was performed using a 21-French rigid cystoscope with  offset lens.  Inspection of urinary bladder revealed friable, but nonpapillary erythema diffusely involving nearly all surfaces of the bladder.  Ureteral orifices were very laterally displaced.  There was no obvious intraluminal papillary tumor  whatsoever.  There did appear to be an area of old resection site, left posterolateral given the significant inflammation in the bladder.  This was difficult to confirm.  Attention was directed at retrograde pyelograms.  Left ureteral orifice was cannulated with a  6-French end-hole catheter, and a left retrograde pyelogram was obtained.  Left retrograde pyelogram demonstrated single left ureter, single system left kidney.  No filling defects or narrowing noted.  There were numerous small clips along the area of the iliac vessels consistent with known node dissection previously.  Next,  right retrograde pyelogram was obtained.  Right retrograde pyelogram demonstrated single right ureter, single system right kidney.  No filling defects or narrowing noted.  Cystoscope was then exchanged a 26-French resectoscope sheath with visual obturator.  This allowed visualization of the  bladder with continuous flow. It was again confirmed there was no obvious residual intraluminal papillary tumor. The safest way to rule out urothelial carcinoma in situ and deep involvement would be aggressive cold cup biopsies, as such cold cup biopsy  forceps were used to obtain representative mucosal samples of the  bladder erythema and then separate samples of the deep aspect of this were set aside as well.  The deep aspects clearly contained gross bladder muscle fibers.  Next resectoscope loop was  used to fulgurate these sample areas as well as all accessible areas of diffuse erythema. The areas of tissue sampling were carefully inspected.  There was no evidence of perforation.  Total fulguration area was approximately 6-7 cm2.  We achieved the goals of the  surgery today.  Bladder was partially emptied per cystoscope.  Procedure was terminated.  The patient tolerated the procedure well, no immediate complications.  The patient was taken to postanesthesia care in stable condition.     PUS D: 11/07/2022 12:31:14 pm T: 11/07/2022 7:04:00 pm  JOB: [id number]/ [id number]

## 2022-11-07 NOTE — Discharge Instructions (Addendum)
1 - You may have urinary urgency (bladder spasms) and bloody urine on / off for up to 2 weeks. This is normal.  2 - Call MD or go to ER for fever >102, severe pain / nausea / vomiting not relieved by medications, or acute change in medical status      Post Anesthesia Home Care Instructions  Activity: Get plenty of rest for the remainder of the day. A responsible individual must stay with you for 24 hours following the procedure.  For the next 24 hours, DO NOT: -Drive a car -Paediatric nurse -Drink alcoholic beverages -Take any medication unless instructed by your physician -Make any legal decisions or sign important papers.  Meals: Start with liquid foods such as gelatin or soup. Progress to regular foods as tolerated. Avoid greasy, spicy, heavy foods. If nausea and/or vomiting occur, drink only clear liquids until the nausea and/or vomiting subsides. Call your physician if vomiting continues.  Special Instructions/Symptoms: Your throat may feel dry or sore from the anesthesia or the breathing tube placed in your throat during surgery. If this causes discomfort, gargle with warm salt water. The discomfort should disappear within 24 hours.

## 2022-11-10 ENCOUNTER — Encounter (HOSPITAL_BASED_OUTPATIENT_CLINIC_OR_DEPARTMENT_OTHER): Payer: Self-pay | Admitting: Urology

## 2022-11-11 LAB — SURGICAL PATHOLOGY

## 2022-11-24 DIAGNOSIS — C678 Malignant neoplasm of overlapping sites of bladder: Secondary | ICD-10-CM | POA: Diagnosis not present

## 2022-12-04 DIAGNOSIS — B9620 Unspecified Escherichia coli [E. coli] as the cause of diseases classified elsewhere: Secondary | ICD-10-CM | POA: Diagnosis not present

## 2022-12-04 DIAGNOSIS — Z5111 Encounter for antineoplastic chemotherapy: Secondary | ICD-10-CM | POA: Diagnosis not present

## 2022-12-04 DIAGNOSIS — C678 Malignant neoplasm of overlapping sites of bladder: Secondary | ICD-10-CM | POA: Diagnosis not present

## 2022-12-04 DIAGNOSIS — N390 Urinary tract infection, site not specified: Secondary | ICD-10-CM | POA: Diagnosis not present

## 2022-12-12 DIAGNOSIS — R8271 Bacteriuria: Secondary | ICD-10-CM | POA: Diagnosis not present

## 2022-12-12 DIAGNOSIS — C678 Malignant neoplasm of overlapping sites of bladder: Secondary | ICD-10-CM | POA: Diagnosis not present

## 2022-12-19 DIAGNOSIS — C678 Malignant neoplasm of overlapping sites of bladder: Secondary | ICD-10-CM | POA: Diagnosis not present

## 2022-12-19 DIAGNOSIS — Z5111 Encounter for antineoplastic chemotherapy: Secondary | ICD-10-CM | POA: Diagnosis not present

## 2022-12-26 DIAGNOSIS — Z5111 Encounter for antineoplastic chemotherapy: Secondary | ICD-10-CM | POA: Diagnosis not present

## 2022-12-26 DIAGNOSIS — C678 Malignant neoplasm of overlapping sites of bladder: Secondary | ICD-10-CM | POA: Diagnosis not present

## 2023-01-02 DIAGNOSIS — C678 Malignant neoplasm of overlapping sites of bladder: Secondary | ICD-10-CM | POA: Diagnosis not present

## 2023-01-02 DIAGNOSIS — Z5111 Encounter for antineoplastic chemotherapy: Secondary | ICD-10-CM | POA: Diagnosis not present

## 2023-01-09 DIAGNOSIS — Z5111 Encounter for antineoplastic chemotherapy: Secondary | ICD-10-CM | POA: Diagnosis not present

## 2023-01-09 DIAGNOSIS — B9620 Unspecified Escherichia coli [E. coli] as the cause of diseases classified elsewhere: Secondary | ICD-10-CM | POA: Diagnosis not present

## 2023-01-09 DIAGNOSIS — C678 Malignant neoplasm of overlapping sites of bladder: Secondary | ICD-10-CM | POA: Diagnosis not present

## 2023-01-09 DIAGNOSIS — N390 Urinary tract infection, site not specified: Secondary | ICD-10-CM | POA: Diagnosis not present

## 2023-01-16 DIAGNOSIS — N390 Urinary tract infection, site not specified: Secondary | ICD-10-CM | POA: Diagnosis not present

## 2023-01-16 DIAGNOSIS — B9620 Unspecified Escherichia coli [E. coli] as the cause of diseases classified elsewhere: Secondary | ICD-10-CM | POA: Diagnosis not present

## 2023-01-16 DIAGNOSIS — C678 Malignant neoplasm of overlapping sites of bladder: Secondary | ICD-10-CM | POA: Diagnosis not present

## 2023-01-26 DIAGNOSIS — Z5111 Encounter for antineoplastic chemotherapy: Secondary | ICD-10-CM | POA: Diagnosis not present

## 2023-01-26 DIAGNOSIS — C678 Malignant neoplasm of overlapping sites of bladder: Secondary | ICD-10-CM | POA: Diagnosis not present

## 2023-03-03 DIAGNOSIS — C678 Malignant neoplasm of overlapping sites of bladder: Secondary | ICD-10-CM | POA: Diagnosis not present

## 2023-03-03 DIAGNOSIS — R8271 Bacteriuria: Secondary | ICD-10-CM | POA: Diagnosis not present

## 2023-09-11 ENCOUNTER — Other Ambulatory Visit (HOSPITAL_COMMUNITY): Payer: Self-pay | Admitting: Family Medicine

## 2023-09-11 DIAGNOSIS — D510 Vitamin B12 deficiency anemia due to intrinsic factor deficiency: Secondary | ICD-10-CM | POA: Diagnosis not present

## 2023-09-11 DIAGNOSIS — Z0001 Encounter for general adult medical examination with abnormal findings: Secondary | ICD-10-CM | POA: Diagnosis not present

## 2023-09-11 DIAGNOSIS — J4520 Mild intermittent asthma, uncomplicated: Secondary | ICD-10-CM | POA: Diagnosis not present

## 2023-09-11 DIAGNOSIS — Z1231 Encounter for screening mammogram for malignant neoplasm of breast: Secondary | ICD-10-CM

## 2023-09-11 DIAGNOSIS — M5000 Cervical disc disorder with myelopathy, unspecified cervical region: Secondary | ICD-10-CM | POA: Diagnosis not present

## 2023-09-11 DIAGNOSIS — Z1331 Encounter for screening for depression: Secondary | ICD-10-CM | POA: Diagnosis not present

## 2023-09-11 DIAGNOSIS — C679 Malignant neoplasm of bladder, unspecified: Secondary | ICD-10-CM | POA: Diagnosis not present

## 2023-09-11 DIAGNOSIS — E6609 Other obesity due to excess calories: Secondary | ICD-10-CM | POA: Diagnosis not present

## 2023-09-11 DIAGNOSIS — I10 Essential (primary) hypertension: Secondary | ICD-10-CM | POA: Diagnosis not present

## 2023-09-11 DIAGNOSIS — Z6830 Body mass index (BMI) 30.0-30.9, adult: Secondary | ICD-10-CM | POA: Diagnosis not present

## 2023-09-11 DIAGNOSIS — N390 Urinary tract infection, site not specified: Secondary | ICD-10-CM | POA: Diagnosis not present

## 2023-10-23 DIAGNOSIS — D510 Vitamin B12 deficiency anemia due to intrinsic factor deficiency: Secondary | ICD-10-CM | POA: Diagnosis not present

## 2023-10-30 DIAGNOSIS — D510 Vitamin B12 deficiency anemia due to intrinsic factor deficiency: Secondary | ICD-10-CM | POA: Diagnosis not present

## 2023-11-06 DIAGNOSIS — D510 Vitamin B12 deficiency anemia due to intrinsic factor deficiency: Secondary | ICD-10-CM | POA: Diagnosis not present

## 2023-11-13 DIAGNOSIS — D510 Vitamin B12 deficiency anemia due to intrinsic factor deficiency: Secondary | ICD-10-CM | POA: Diagnosis not present

## 2023-11-17 DIAGNOSIS — C678 Malignant neoplasm of overlapping sites of bladder: Secondary | ICD-10-CM | POA: Diagnosis not present

## 2023-11-17 DIAGNOSIS — N390 Urinary tract infection, site not specified: Secondary | ICD-10-CM | POA: Diagnosis not present

## 2024-01-04 DIAGNOSIS — R8271 Bacteriuria: Secondary | ICD-10-CM | POA: Diagnosis not present

## 2024-01-08 ENCOUNTER — Other Ambulatory Visit: Payer: Self-pay

## 2024-01-08 ENCOUNTER — Encounter (HOSPITAL_BASED_OUTPATIENT_CLINIC_OR_DEPARTMENT_OTHER): Payer: Self-pay | Admitting: Urology

## 2024-01-08 ENCOUNTER — Other Ambulatory Visit: Payer: Self-pay | Admitting: Urology

## 2024-01-08 NOTE — Progress Notes (Addendum)
Spoke w/ via phone for pre-op interview---[facility] needs dos----  ISTAT, EKG       Lab results------none COVID test -----patient states asymptomatic no test needed Arrive at -------0645 on Wednesday, 01/13/2024 NPO after MN NO Solid Food.  Clear liquids from MN until---0545 Med rec completed Medications to take morning of surgery -----Tylenol prn, Albuterol prn Diabetic medication -----n/a Patient instructed no nail polish to be worn day of surgery Patient instructed to bring photo id and insurance card day of surgery Patient aware to have Driver (ride ) / caregiver    for 24 hours after surgery - husband, Tim Patient Special Instructions -----Bring albuterol inhaler. Patient is aware that dentures must be removed prior to OR. Pre-Op special Instructions -----Surgeon orders need second sign. Case just added 01/08/24. Patient verbalized understanding of instructions that were given at this phone interview. Patient denies chest pain, sob, fever, cough at the interview.

## 2024-01-13 ENCOUNTER — Encounter (HOSPITAL_BASED_OUTPATIENT_CLINIC_OR_DEPARTMENT_OTHER): Payer: Self-pay | Admitting: Urology

## 2024-01-13 ENCOUNTER — Ambulatory Visit (HOSPITAL_BASED_OUTPATIENT_CLINIC_OR_DEPARTMENT_OTHER)
Admission: RE | Admit: 2024-01-13 | Discharge: 2024-01-13 | Disposition: A | Discharge status: Home / Self Care | Payer: 59 | Attending: Urology | Admitting: Urology

## 2024-01-13 ENCOUNTER — Ambulatory Visit (HOSPITAL_BASED_OUTPATIENT_CLINIC_OR_DEPARTMENT_OTHER): Payer: 59 | Admitting: Anesthesiology

## 2024-01-13 ENCOUNTER — Encounter (HOSPITAL_BASED_OUTPATIENT_CLINIC_OR_DEPARTMENT_OTHER)
Admission: RE | Disposition: A | Discharge status: Home / Self Care | Payer: Self-pay | Source: Home / Self Care | Attending: Urology

## 2024-01-13 DIAGNOSIS — I10 Essential (primary) hypertension: Secondary | ICD-10-CM | POA: Insufficient documentation

## 2024-01-13 DIAGNOSIS — N3289 Other specified disorders of bladder: Secondary | ICD-10-CM | POA: Diagnosis not present

## 2024-01-13 DIAGNOSIS — Z90710 Acquired absence of both cervix and uterus: Secondary | ICD-10-CM | POA: Insufficient documentation

## 2024-01-13 DIAGNOSIS — Z79899 Other long term (current) drug therapy: Secondary | ICD-10-CM | POA: Insufficient documentation

## 2024-01-13 DIAGNOSIS — D494 Neoplasm of unspecified behavior of bladder: Secondary | ICD-10-CM | POA: Diagnosis not present

## 2024-01-13 DIAGNOSIS — Z87891 Personal history of nicotine dependence: Secondary | ICD-10-CM | POA: Insufficient documentation

## 2024-01-13 DIAGNOSIS — N3000 Acute cystitis without hematuria: Secondary | ICD-10-CM | POA: Diagnosis not present

## 2024-01-13 DIAGNOSIS — J4489 Other specified chronic obstructive pulmonary disease: Secondary | ICD-10-CM | POA: Diagnosis not present

## 2024-01-13 DIAGNOSIS — Z01818 Encounter for other preprocedural examination: Secondary | ICD-10-CM

## 2024-01-13 DIAGNOSIS — K219 Gastro-esophageal reflux disease without esophagitis: Secondary | ICD-10-CM | POA: Insufficient documentation

## 2024-01-13 DIAGNOSIS — Z8541 Personal history of malignant neoplasm of cervix uteri: Secondary | ICD-10-CM | POA: Diagnosis not present

## 2024-01-13 DIAGNOSIS — N3020 Other chronic cystitis without hematuria: Secondary | ICD-10-CM | POA: Diagnosis not present

## 2024-01-13 DIAGNOSIS — C679 Malignant neoplasm of bladder, unspecified: Secondary | ICD-10-CM | POA: Diagnosis not present

## 2024-01-13 HISTORY — DX: Presence of spectacles and contact lenses: Z97.3

## 2024-01-13 HISTORY — DX: Presence of dental prosthetic device (complete) (partial): Z97.2

## 2024-01-13 HISTORY — PX: CYSTOSCOPY: SHX5120

## 2024-01-13 HISTORY — DX: Pneumonia, unspecified organism: J18.9

## 2024-01-13 HISTORY — PX: TRANSURETHRAL RESECTION OF BLADDER TUMOR: SHX2575

## 2024-01-13 LAB — POCT I-STAT, CHEM 8
BUN: 15 mg/dL (ref 8–23)
Calcium, Ion: 1.26 mmol/L (ref 1.15–1.40)
Chloride: 107 mmol/L (ref 98–111)
Creatinine, Ser: 1.4 mg/dL — ABNORMAL HIGH (ref 0.44–1.00)
Glucose, Bld: 95 mg/dL (ref 70–99)
HCT: 41 % (ref 36.0–46.0)
Hemoglobin: 13.9 g/dL (ref 12.0–15.0)
Potassium: 4.3 mmol/L (ref 3.5–5.1)
Sodium: 142 mmol/L (ref 135–145)
TCO2: 22 mmol/L (ref 22–32)

## 2024-01-13 SURGERY — TURBT (TRANSURETHRAL RESECTION OF BLADDER TUMOR)
Anesthesia: General | Site: Bladder | Laterality: Bilateral

## 2024-01-13 MED ORDER — EPHEDRINE SULFATE-NACL 50-0.9 MG/10ML-% IV SOSY
PREFILLED_SYRINGE | INTRAVENOUS | Status: DC | PRN
  Administered 2024-01-13: 10 mg via INTRAVENOUS
  Administered 2024-01-13: 15 mg via INTRAVENOUS

## 2024-01-13 MED ORDER — FENTANYL CITRATE (PF) 100 MCG/2ML IJ SOLN
INTRAMUSCULAR | Status: DC | PRN
  Administered 2024-01-13: 50 ug via INTRAVENOUS

## 2024-01-13 MED ORDER — FENTANYL CITRATE (PF) 100 MCG/2ML IJ SOLN
INTRAMUSCULAR | Status: AC
  Filled 2024-01-13: qty 2

## 2024-01-13 MED ORDER — LIDOCAINE 2% (20 MG/ML) 5 ML SYRINGE
INTRAMUSCULAR | Status: DC | PRN
  Administered 2024-01-13: 100 mg via INTRAVENOUS

## 2024-01-13 MED ORDER — OXYCODONE HCL 5 MG PO TABS
5.0000 mg | ORAL_TABLET | Freq: Once | ORAL | Status: AC | PRN
  Administered 2024-01-13: 5 mg via ORAL

## 2024-01-13 MED ORDER — SODIUM CHLORIDE 0.9 % IR SOLN
Status: DC | PRN
  Administered 2024-01-13 (×2): 3000 mL via INTRAVESICAL

## 2024-01-13 MED ORDER — ONDANSETRON HCL 4 MG/2ML IJ SOLN
INTRAMUSCULAR | Status: AC
Start: 2024-01-13 — End: ?
  Filled 2024-01-13: qty 2

## 2024-01-13 MED ORDER — ACETAMINOPHEN 500 MG PO TABS
1000.0000 mg | ORAL_TABLET | Freq: Once | ORAL | Status: AC
  Administered 2024-01-13: 1000 mg via ORAL

## 2024-01-13 MED ORDER — OXYCODONE HCL 5 MG/5ML PO SOLN: 5.0000 mg | Freq: Once | ORAL | Status: AC | PRN

## 2024-01-13 MED ORDER — DROPERIDOL 2.5 MG/ML IJ SOLN: 0.6250 mg | Freq: Once | INTRAMUSCULAR | Status: DC | PRN

## 2024-01-13 MED ORDER — FENTANYL CITRATE (PF) 100 MCG/2ML IJ SOLN: 25.0000 ug | INTRAMUSCULAR | Status: DC | PRN

## 2024-01-13 MED ORDER — OXYCODONE HCL 5 MG PO TABS
ORAL_TABLET | ORAL | Status: AC
  Filled 2024-01-13: qty 1

## 2024-01-13 MED ORDER — SENNOSIDES-DOCUSATE SODIUM 8.6-50 MG PO TABS
1.0000 | ORAL_TABLET | Freq: Two times a day (BID) | ORAL | 0 refills | Status: DC
Start: 2024-01-13 — End: 2024-09-21

## 2024-01-13 MED ORDER — ACETAMINOPHEN 500 MG PO TABS
ORAL_TABLET | ORAL | Status: AC
  Filled 2024-01-13: qty 2

## 2024-01-13 MED ORDER — ONDANSETRON HCL 4 MG/2ML IJ SOLN
INTRAMUSCULAR | Status: DC | PRN
  Administered 2024-01-13: 4 mg via INTRAVENOUS

## 2024-01-13 MED ORDER — GENTAMICIN SULFATE 40 MG/ML IJ SOLN
5.0000 mg/kg | INTRAVENOUS | Status: AC
  Administered 2024-01-13: 360 mg via INTRAVENOUS
  Filled 2024-01-13: qty 9

## 2024-01-13 MED ORDER — PROPOFOL 10 MG/ML IV BOLUS
INTRAVENOUS | Status: DC | PRN
  Administered 2024-01-13: 200 mg via INTRAVENOUS

## 2024-01-13 MED ORDER — HYDROCODONE-ACETAMINOPHEN 5-325 MG PO TABS: 1.0000 | ORAL_TABLET | ORAL | 0 refills | Status: DC | PRN

## 2024-01-13 MED ORDER — STERILE WATER FOR IRRIGATION IR SOLN
Status: DC | PRN
  Administered 2024-01-13: 500 mL

## 2024-01-13 MED ORDER — DEXAMETHASONE SODIUM PHOSPHATE 10 MG/ML IJ SOLN
INTRAMUSCULAR | Status: DC | PRN
  Administered 2024-01-13: 5 mg via INTRAVENOUS

## 2024-01-13 MED ORDER — EPHEDRINE 5 MG/ML INJ
INTRAVENOUS | Status: AC
  Filled 2024-01-13: qty 5

## 2024-01-13 MED ORDER — LACTATED RINGERS IV SOLN: INTRAVENOUS | Status: DC

## 2024-01-13 MED ORDER — IOHEXOL 300 MG/ML  SOLN
INTRAMUSCULAR | Status: DC | PRN
  Administered 2024-01-13: 8 mL

## 2024-01-13 SURGICAL SUPPLY — 23 items
BAG DRAIN URO-CYSTO SKYTR STRL (DRAIN) ×2 IMPLANT
BAG URINE DRAIN 2000ML AR STRL (UROLOGICAL SUPPLIES) IMPLANT
BAG URINE LEG 500ML (DRAIN) IMPLANT
CATH FOLEY 2WAY SLVR 30CC 20FR (CATHETERS) IMPLANT
CATH FOLEY 2WAY SLVR 5CC 22FR (CATHETERS) IMPLANT
CATH URETL OPEN END 6FR 70 (CATHETERS) IMPLANT
CLOTH BEACON ORANGE TIMEOUT ST (SAFETY) ×2 IMPLANT
ELECT REM PT RETURN 9FT ADLT (ELECTROSURGICAL)
ELECTRODE REM PT RTRN 9FT ADLT (ELECTROSURGICAL) ×2 IMPLANT
EVACUATOR MICROVAS BLADDER (UROLOGICAL SUPPLIES) IMPLANT
GLOVE BIO SURGEON STRL SZ7.5 (GLOVE) ×2 IMPLANT
GOWN STRL REUS W/TWL LRG LVL3 (GOWN DISPOSABLE) ×2 IMPLANT
GUIDEWIRE STR DUAL SENSOR (WIRE) IMPLANT
IV NS IRRIG 3000ML ARTHROMATIC (IV SOLUTION) IMPLANT
KIT TURNOVER CYSTO (KITS) ×2 IMPLANT
LOOP CUT BIPOLAR 24F LRG (ELECTROSURGICAL) IMPLANT
MANIFOLD NEPTUNE II (INSTRUMENTS) IMPLANT
PACK CYSTO (CUSTOM PROCEDURE TRAY) ×2 IMPLANT
SLEEVE SCD COMPRESS KNEE MED (STOCKING) ×2 IMPLANT
SYR TOOMEY IRRIG 70ML (MISCELLANEOUS)
SYRINGE TOOMEY IRRIG 70ML (MISCELLANEOUS) IMPLANT
TUBE CONNECTING 12X1/4 (SUCTIONS) IMPLANT
WATER STERILE IRR 500ML POUR (IV SOLUTION) IMPLANT

## 2024-01-13 NOTE — Anesthesia Preprocedure Evaluation (Addendum)
Anesthesia Evaluation  Patient identified by MRN, date of birth, ID band Patient awake    Reviewed: Allergy & Precautions, NPO status , Patient's Chart, lab work & pertinent test results  History of Anesthesia Complications Negative for: history of anesthetic complications  Airway Mallampati: II  TM Distance: >3 FB Neck ROM: Full    Dental  (+) Lower Dentures, Upper Dentures   Pulmonary asthma , COPD, former smoker   Pulmonary exam normal        Cardiovascular hypertension, Pt. on medications Normal cardiovascular exam     Neuro/Psych negative neurological ROS     GI/Hepatic Neg liver ROS,GERD  Controlled,,  Endo/Other    Renal/GU negative Renal ROS     Musculoskeletal negative musculoskeletal ROS (+)    Abdominal   Peds  Hematology negative hematology ROS (+)   Anesthesia Other Findings Bladder ca  Reproductive/Obstetrics                             Anesthesia Physical Anesthesia Plan  ASA: 2  Anesthesia Plan: General   Post-op Pain Management: Tylenol PO (pre-op)*   Induction: Intravenous  PONV Risk Score and Plan: 3 and Treatment may vary due to age or medical condition, Ondansetron, Dexamethasone and Midazolam  Airway Management Planned: LMA  Additional Equipment: None  Intra-op Plan:   Post-operative Plan: Extubation in OR  Informed Consent: I have reviewed the patients History and Physical, chart, labs and discussed the procedure including the risks, benefits and alternatives for the proposed anesthesia with the patient or authorized representative who has indicated his/her understanding and acceptance.     Dental advisory given  Plan Discussed with: CRNA  Anesthesia Plan Comments:        Anesthesia Quick Evaluation

## 2024-01-13 NOTE — Anesthesia Procedure Notes (Signed)
Procedure Name: LMA Insertion Date/Time: 01/13/2024 8:46 AM  Performed by: Bishop Limbo, CRNAPre-anesthesia Checklist: Patient identified, Emergency Drugs available, Suction available and Patient being monitored Patient Re-evaluated:Patient Re-evaluated prior to induction Oxygen Delivery Method: Circle System Utilized Preoxygenation: Pre-oxygenation with 100% oxygen Induction Type: IV induction Ventilation: Mask ventilation without difficulty LMA: LMA inserted LMA Size: 4.0 Number of attempts: 1 Placement Confirmation: positive ETCO2 Tube secured with: Tape Dental Injury: Teeth and Oropharynx as per pre-operative assessment

## 2024-01-13 NOTE — H&P (Signed)
[name] is an 63 y.o. female.    Chief Complaint: Pre-OP Transurethral Resection of Bladder Tumor, Retrogrades  HPI:   1 - High Grade Bladder Cancer - T1/2 high grade urothlial carcinoma by TURBT 08/2022 by Dr. Ronne Binning. CT localized, singl ureters bilterally. She has h/o TAH/XRT/node dissection for h/o cervical cancer. Remote 20PY smoker. Cr 2's per report and not cisplatinum candidate per medical oncology. Not candidate for signifciant pelvic radiation given already high dose in area. There was some questions on prior path if truly muscle invasive. ?  ?Recent Course:  ?10/2022 - Restaging TURBT - CIS only, muscle not involved with aggressive deep sampling ==> Induction BCG x 6 ? 02/2023 - Cysto diffuse erythema c/w post-BCG effect / cystitis, no gross tumor;  10/2023 - new likely tumor Rt bladder neck and progressive diffuse erythema. ?  ?PMH sig for mild obesity, TAH/XRT/Node Dissection for cervical cancer,COPD (not limiting), C spine fusion. Her husband Jorja Loa is very involved. Her PCP is Terie Purser PA   ??Today "[name]" is seen to proceed with cysto, bilateral retrogrades, TURBT for suspect recurrent bladder cancer. No interval fevers. Most recent UCX e.coli for whch she was treated with CX-specific bactrim course.    Past Medical History:  Diagnosis Date   Asthma    rarely uses   Cancer [facility]) 07/2022   Bladder cancer, treated with 6 treatments of BCG, follows w/ [facility].   Cervical cancer (HCC) 1992   treated w/ hysterectomy and radiation   GERD (gastroesophageal reflux disease)    no longer suffering from   Hyperlipidemia    Hypertension    Follows w/ Terie Purser, PA.   IFG (impaired fasting glucose)    Neuromuscular disorder (HCC) 2023   Patient has numbness and tingling in arms and hands from back surgery.   Obesity    Peripheral vertigo    Pneumonia    about 20 years ago per pt on 01/08/24   Thyroid disease    hx of many years ago   Wears dentures     upper and lower   Wears glasses     Past Surgical History:  Procedure Laterality Date   ABDOMINAL HYSTERECTOMY  1992   ANTERIOR CERVICAL DECOMP/DISCECTOMY FUSION N/A 09/30/2022   Procedure: CERVICAL FOUR-FIVE, CERVICAL FIVE-SIX, CERVICAL SIX-SEVEN ANTERIOR CERVICAL DECOMPRESSION/DISCECTOMY FUSION;  Surgeon: Jadene Pierini, MD;  Location: [facility];  Service: Neurosurgery;  Laterality: N/A;   APPENDECTOMY  1992   CYSTOSCOPY W/ RETROGRADES Bilateral 08/26/2022   Procedure: CYSTOSCOPY WITH RETROGRADE PYELOGRAM;  Surgeon: Malen Gauze, MD;  Location: [facility];  Service: Urology;  Laterality: Bilateral;   CYSTOSCOPY W/ RETROGRADES Bilateral 11/07/2022   Procedure: CYSTOSCOPY WITH RETROGRADE PYELOGRAM;  Surgeon: Sebastian Ache, MD;  Location: [facility];  Service: Urology;  Laterality: Bilateral;   TRANSURETHRAL RESECTION OF BLADDER TUMOR N/A 08/26/2022   Procedure: TRANSURETHRAL RESECTION OF BLADDER TUMOR (TURBT);  Surgeon: Malen Gauze, MD;  Location: [facility];  Service: Urology;  Laterality: N/A;   TRANSURETHRAL RESECTION OF BLADDER TUMOR N/A 11/07/2022   Procedure: TRANSURETHRAL RESECTION OF BLADDER TUMOR (TURBT);  Surgeon: Sebastian Ache, MD;  Location: [facility];  Service: Urology;  Laterality: N/A;  1 HR    Family History  Problem Relation Age of Onset   Cancer Mother        colon   Stroke Mother    Heart disease Father    Heart disease Maternal Grandmother    Cancer Maternal Grandfather  colon   Cancer Son        Leukemia   Social History:  reports that she quit smoking about 33 years ago. Her smoking use included cigarettes. She has never used smokeless tobacco. She reports that she does not drink alcohol and does not use drugs.  Allergies:  Allergies  Allergen Reactions   Augmentin [Amoxicillin-Pot Clavulanate] Diarrhea and Nausea And Vomiting    Pt decreased dose to QD HS and tolerated well    No medications prior to  admission.    No results found for this or any previous visit (from the past 48 hours). No results found.  Review of Systems  Constitutional:  Negative for chills and fever.  Genitourinary:  Positive for frequency, hematuria and urgency.  All other systems reviewed and are negative.   Height 5\' 8"  (1.727 m), weight 72.6 kg, last menstrual period 02/05/1991. Physical Exam HENT:     Head: Normocephalic.  Eyes:     Pupils: Pupils are equal, round, and reactive to light.  Cardiovascular:     Rate and Rhythm: Normal rate.  Pulmonary:     Effort: Pulmonary effort is normal.  Abdominal:     Comments: Stable mild obesity  Genitourinary:    Comments: No CVAT Musculoskeletal:        General: Normal range of motion.     Cervical back: Normal range of motion.  Skin:    General: Skin is warm.  Neurological:     Mental Status: She is alert.  Psychiatric:        Mood and Affect: Mood normal.      Assessment/Plan  Proceed as planned with cysto, bilateral retrogrades, TURBT. Risks, benefits, alternatives, expected peri-op course discussed previously and reiterated today.   Loletta Parish., MD 01/13/2024, 6:03 AM

## 2024-01-13 NOTE — Anesthesia Postprocedure Evaluation (Signed)
Anesthesia Post Note  Patient: Christianne Dolin  Procedure(s) Performed: TRANSURETHRAL RESECTION OF BLADDER TUMOR (TURBT) BILATERAL RETROGRADE PYELOGRAM (Bilateral: Bladder) CYSTOSCOPY (Bladder)     Patient location during evaluation: PACU Anesthesia Type: General Level of consciousness: awake and alert Pain management: pain level controlled Vital Signs Assessment: post-procedure vital signs reviewed and stable Respiratory status: spontaneous breathing, nonlabored ventilation and respiratory function stable Cardiovascular status: blood pressure returned to baseline Postop Assessment: no apparent nausea or vomiting Anesthetic complications: no   No notable events documented.  Last Vitals:  Vitals:   01/13/24 1015 01/13/24 1045  BP:  (!) 144/79  Pulse:  72  Resp:  16  Temp: 36.6 C (!) 36.3 C  SpO2:  98%    Last Pain:  Vitals:   01/13/24 1015  TempSrc:   PainSc: 5                  Shanda Howells

## 2024-01-13 NOTE — Discharge Instructions (Addendum)
1 - You may have urinary urgency (bladder spasms) and bloody urine on / off for up to 2 weeks.   2 - Call MD or go to ER for fever >102, severe pain / nausea / vomiting not relieved by medications, or acute change in medical status   You were given Tylenol prior to your surgery today, please do not take anymore Tylenol until after 1pm today Post Anesthesia Home Care Instructions  Activity: Get plenty of rest for the remainder of the day. A responsible individual must stay with you for 24 hours following the procedure.  For the next 24 hours, DO NOT: -Drive a car -Advertising copywriter -Drink alcoholic beverages -Take any medication unless instructed by your physician -Make any legal decisions or sign important papers.  Meals: Start with liquid foods such as gelatin or soup. Progress to regular foods as tolerated. Avoid greasy, spicy, heavy foods. If nausea and/or vomiting occur, drink only clear liquids until the nausea and/or vomiting subsides. Call your physician if vomiting continues.  Special Instructions/Symptoms: Your throat may feel dry or sore from the anesthesia or the breathing tube placed in your throat during surgery. If this causes discomfort, gargle with warm salt water. The discomfort should disappear within 24 hours.  ICYSTOSCOPY HOME CARE INSTRUCTIONS  Activity: Rest for the remainder of the day.  Do not drive or operate equipment today.  You may resume normal activities in one to two days as instructed by your physician.   Meals: Drink plenty of liquids and eat light foods such as gelatin or soup this evening.  You may return to a normal meal plan tomorrow.  Return to Work: You may return to work in one to two days or as instructed by your physician.  Special Instructions / Symptoms: Call your physician if any of these symptoms occur:   -persistent or heavy bleeding  -bleeding which continues after first few urination  -large blood clots that are difficult to  pass  -urine stream diminishes or stops completely  -fever equal to or higher than 101 degrees Farenheit.  -cloudy urine with a strong, foul odor  -severe pain  Females should always wipe from front to back after elimination.  You may feel some burning pain when you urinate.  This should disappear with time.  Applying moist heat to the lower abdomen or a hot tub bath may help relieve the pain. \

## 2024-01-13 NOTE — Transfer of Care (Signed)
Immediate Anesthesia Transfer of Care Note  Patient: [name]  Procedure(s) Performed: TRANSURETHRAL RESECTION OF BLADDER TUMOR (TURBT) BILATERAL RETROGRADE PYELOGRAM (Bilateral: Bladder) CYSTOSCOPY (Bladder)  Patient Location: PACU  Anesthesia Type:General  Level of Consciousness: awake, alert , oriented, and patient cooperative  Airway & Oxygen Therapy: Patient Spontanous Breathing  Post-op Assessment: Report given to RN and Post -op Vital signs reviewed and stable  Post vital signs: Reviewed and stable  Last Vitals:  Vitals Value Taken Time  BP 133/63 01/13/24 1000  Temp 36.4 C 01/13/24 0926  Pulse 73 01/13/24 1001  Resp 18 01/13/24 1001  SpO2 95 % 01/13/24 1001  Vitals shown include unfiled device data.  Last Pain:  Vitals:   01/13/24 0945  TempSrc:   PainSc: 9       Patients Stated Pain Goal: 3 (01/13/24 0703)  Complications: No notable events documented.

## 2024-01-13 NOTE — Brief Op Note (Signed)
01/13/2024  9:15 AM  PATIENT:  [name]  63 y.o. female  PRE-OPERATIVE DIAGNOSIS:  RECURRENT BLADDER CANCER  POST-OPERATIVE DIAGNOSIS:  RECURRENT BLADDER CANCER  PROCEDURE:  Procedure(s): TRANSURETHRAL RESECTION OF BLADDER TUMOR (TURBT) BILATERAL RETROGRADE PYELOGRAM (Bilateral) CYSTOSCOPY  SURGEON:  Surgeons and Role:    * Keiley Levey, Delbert Phenix., MD - Primary  PHYSICIAN ASSISTANT:   ASSISTANTS: none   ANESTHESIA:   general  EBL:  minimal   BLOOD ADMINISTERED:none  DRAINS: none   LOCAL MEDICATIONS USED:  NONE  SPECIMEN:  Source of Specimen:  bladder erythema  DISPOSITION OF SPECIMEN:  PATHOLOGY  COUNTS:  YES  TOURNIQUET:  * No tourniquets in log *  DICTATION: .Other Dictation: Dictation Number [id number]  PLAN OF CARE: Discharge to home after PACU  PATIENT DISPOSITION:  PACU - hemodynamically stable.   Delay start of Pharmacological VTE agent (>24hrs) due to surgical blood loss or risk of bleeding: yes

## 2024-01-13 NOTE — Op Note (Unsigned)
NAMEKORINE, ISGETT MEDICAL RECORD NO: [id number] ACCOUNT NO: [id number] DATE OF BIRTH: [birth year] FACILITY: [facility] LOCATION: [facility] PHYSICIAN: Sebastian Ache, MD  Operative Report   DATE OF PROCEDURE: 01/13/2024  PREOPERATIVE DIAGNOSIS:  Suspicion for recurrent bladder cancer.  POSTOPERATIVE DIAGNOSIS:  Suspicion for recurrent bladder cancer.  PROCEDURES PERFORMED: 1.  Transurethral resection of bladder tumor, volume medium. 2.  Bilateral retrograde pyelograms, interpretation.  ESTIMATED BLOOD LOSS:  Nil.  COMPLICATION:  None.  SPECIMEN:  Bladder erythema, for permanent pathology.  FINDINGS: 1.  Beefy red erythema in the intertrigone area as well as some on the posterior wall, highly concerning for recurrent carcinoma in situ of the bladder. 2.  Bilateral unremarkable retrograde pyelograms. 3.  Large volume surgical clips in the pelvis extending to the area of the presumed aortic bifurcation. 4.  Approximately 9 cm squared area of erythema, approximately 3.5 cm squared area resected.  INDICATIONS:  The patient is a 63 year old lady with history of high-grade superficial bladder cancer.  She is status post BCG induction previously.  She is compliant with surveillance cystoscopy.  She was found on surveillance cystoscopy as well as  evaluation of worsening irritative voiding to have some new and progressive bladder erythema.  This is concerning for possible recurrence of cancer, especially carcinoma in situ.  Options were discussed including recommended path of transurethral  resection and she presents for this today.  She did have recent bacteruria, which was treated appropriately.  Informed consent was obtained and placed in medical record.  DESCRIPTION OF PROCEDURE:  The patient being verified, procedure being transurethral resection of bladder tumor and retogrades  was confirmed.  Procedure timeout was performed.  Intravenous antibiotics were administered.  General LMA  anesthesia induced.   The patient placed into a low lithotomy position. A sterile field was created, prepped and draped the patient's vagina, introitus, proximal thigh using iodine.  Cystourethroscopy was performed using 21-French rigid cystoscope with offset lens.   Inspection of urinary bladder revealed multifocal, very significant beefy red erythema that was very friable, mostly in the intertrigone area as well as some extending lateral to the orifices and encroaching the posterior wall.  Total surface area of the worrisome erythema was  was at least 9 cm.  Left ureteral orifice was cannulated with a 6-French end-hole catheter and left retrograde pyelogram was obtained.  Left retrograde pyelogram demonstrated single left ureter, single system left kidney.  No filling defects or narrowing noted. There are numerous surgical clips noted in the expected distribution of aggressive lymph node dissection.  The right ureteral  orifice was somewhat difficult to appreciate given the erythema but was cannulated with a sensor wire.  Open-ended catheter was advanced over this and right retrograde pyelogram was obtained.  Right retrograde pyelogram demonstrated single right ureter, single system right kidney.  No filling defects or narrowing noted.  The cystoscope was then exchanged with a 26-French resectoscope sheath with visual obturator.  Using medium-sized loop,  resection was performed of representative areas of the erythema in the intertrigone area for at least 3 to 4 cm and this tissue was set aside, labeled as moderate erythema, history of bladder cancer.  The base of this area was fulgurated as well as all  visible areas of erythema, sparing the area of the ureteral orifices.  This resulted in excellent hemostasis and resolution of obvious erythematous areas.  However, I do remain highly suspicious for possible recurrence of carcinoma in situ.  No evidence  of perforation.  Bladder was  emptied per  cystoscope.  Procedure was then terminated. The patient tolerated the procedure well.  No immediate periprocedural complications.  The patient taken to postanesthesia care in stable condition.  Plan for discharge  home.   [facility] D: 01/13/2024 9:19:35 am T: 01/13/2024 9:57:00 am  JOB: [id number]/ [id number]

## 2024-01-14 ENCOUNTER — Encounter (HOSPITAL_BASED_OUTPATIENT_CLINIC_OR_DEPARTMENT_OTHER): Payer: Self-pay | Admitting: Urology

## 2024-01-14 LAB — SURGICAL PATHOLOGY

## 2024-02-01 ENCOUNTER — Encounter (HOSPITAL_COMMUNITY): Payer: Self-pay

## 2024-02-01 ENCOUNTER — Ambulatory Visit (HOSPITAL_COMMUNITY)
Admission: RE | Admit: 2024-02-01 | Discharge: 2024-02-01 | Disposition: A | Discharge status: Home / Self Care | Payer: 59 | Source: Ambulatory Visit | Attending: Family Medicine | Admitting: Family Medicine

## 2024-02-01 DIAGNOSIS — Z1231 Encounter for screening mammogram for malignant neoplasm of breast: Secondary | ICD-10-CM | POA: Insufficient documentation

## 2024-02-02 ENCOUNTER — Inpatient Hospital Stay
Admission: RE | Admit: 2024-02-02 | Discharge: 2024-02-02 | Disposition: A | Discharge status: Home / Self Care | Payer: Self-pay | Source: Ambulatory Visit | Attending: Family Medicine | Admitting: Family Medicine

## 2024-02-02 ENCOUNTER — Other Ambulatory Visit (HOSPITAL_COMMUNITY): Payer: Self-pay | Admitting: Family Medicine

## 2024-02-02 DIAGNOSIS — Z1231 Encounter for screening mammogram for malignant neoplasm of breast: Secondary | ICD-10-CM

## 2024-05-31 DIAGNOSIS — C678 Malignant neoplasm of overlapping sites of bladder: Secondary | ICD-10-CM | POA: Diagnosis not present

## 2024-09-05 DIAGNOSIS — R8271 Bacteriuria: Secondary | ICD-10-CM | POA: Diagnosis not present

## 2024-09-05 DIAGNOSIS — C678 Malignant neoplasm of overlapping sites of bladder: Secondary | ICD-10-CM | POA: Diagnosis not present

## 2024-09-07 ENCOUNTER — Other Ambulatory Visit: Payer: Self-pay | Admitting: Urology

## 2024-09-23 NOTE — Patient Instructions (Signed)
 SURGICAL WAITING ROOM VISITATION Patients having surgery or a procedure may have no more than 2 support people in the waiting area - these visitors may rotate in the visitor waiting room.   Due to an increase in RSV and influenza rates and associated hospitalizations, children ages 31 and under may not visit patients in [facility] hospitals. If the patient needs to stay at the hospital during part of their recovery, the visitor guidelines for inpatient rooms apply.  PRE-OP VISITATION  Pre-op nurse will coordinate an appropriate time for 1 support person to accompany the patient in pre-op.  This support person may not rotate.  This visitor will be contacted when the time is appropriate for the visitor to come back in the pre-op area.  Please refer to the [facility] website for the visitor guidelines for Inpatients (after your surgery is over and you are in a regular room).  You are not required to quarantine at this time prior to your surgery. However, you must do this: Hand Hygiene often Do NOT share personal items Notify your provider if you are in close contact with someone who has COVID or you develop fever 100.4 or greater, new onset of sneezing, cough, sore throat, shortness of breath or body aches.  If you test positive for Covid or have been in contact with anyone that has tested positive in the last 10 days please notify you surgeon.    Your procedure is scheduled on: 10/07/24   Report to [facility] Main Entrance: [place] entrance where the [employer] is available.   Report to admitting at: 8:00 AM  Call this number if you have any questions or problems the morning of surgery [phone]  FOLLOW ANY ADDITIONAL PRE OP INSTRUCTIONS YOU RECEIVED FROM YOUR SURGEON'S OFFICE!!!  Do not eat food or drink fluids after Midnight the night prior to your surgery/procedure.   Oral Hygiene is also important to reduce your risk of infection.        Remember - BRUSH YOUR TEETH  THE MORNING OF SURGERY WITH YOUR REGULAR TOOTHPASTE  Do NOT smoke after Midnight the night before surgery.  STOP TAKING all Vitamins, Herbs and supplements 1 week before your surgery.   Take ONLY these medicines the morning of surgery with A SIP OF WATER : NONE.Use inhalers as usual.Tylenol  as needed.  If You have been diagnosed with Sleep Apnea - Bring CPAP mask and tubing day of surgery. We will provide you with a CPAP machine on the day of your surgery.                   You may not have any metal on your body including hair pins, jewelry, and body piercing  Do not wear make-up, lotions, powders, perfumes / cologne, or deodorant  Do not wear nail polish including gel and S&S, artificial / acrylic nails, or any other type of covering on natural nails including finger and toenails. If you have artificial nails, gel coating, etc., that needs to be removed by a nail salon, Please have this removed prior to surgery. Not doing so may mean that your surgery could be cancelled or delayed if the Surgeon or anesthesia staff feels like they are unable to monitor you safely.   Do not shave 48 hours prior to surgery to avoid nicks in your skin which may contribute to postoperative infections.   Contacts, Hearing Aids, dentures or bridgework may not be worn into surgery. DENTURES WILL BE REMOVED PRIOR TO SURGERY  PLEASE DO NOT APPLY Poly grip OR ADHESIVES!!!  You may bring a small overnight bag with you on the day of surgery, only pack items that are not valuable. [facility] IS NOT RESPONSIBLE   FOR VALUABLES THAT ARE LOST OR STOLEN.   Patients discharged on the day of surgery will not be allowed to drive home.  Someone NEEDS to stay with you for the first 24 hours after anesthesia.  Do not bring your home medications to the hospital. The Pharmacy will dispense medications listed on your medication list to you during your admission in the Hospital.  Special Instructions: Bring a copy of your  healthcare power of attorney and living will documents the day of surgery, if you wish to have them scanned into your [place] [facility] Records- EPIC  Please read over the following fact sheets you were given: IF YOU HAVE QUESTIONS ABOUT YOUR PRE-OP INSTRUCTIONS, PLEASE CALL [phone]  PATIENT SIGNATURE_________________________________  NURSE SIGNATURE__________________________________  ________________________________________________________________________[facility] - Preparing for Surgery Before surgery, you can play an important role.  Because skin is not sterile, your skin needs to be as free of germs as possible.  You can reduce the number of germs on your skin by washing with CHG (chlorahexidine gluconate) soap before surgery.  CHG is an antiseptic cleaner which kills germs and bonds with the skin to continue killing germs even after washing. Please DO NOT use if you have an allergy to CHG or antibacterial soaps.  If your skin becomes reddened/irritated stop using the CHG and inform your nurse when you arrive at Short Stay. Do not shave (including legs and underarms) for at least 48 hours prior to the first CHG shower.  You may shave your face/neck.  Please follow these instructions carefully:  1.  Shower with CHG Soap the night before surgery ONLY (DO NOT USE THE SOAP THE MORNING OF SURGERY).  2.  If you choose to wash your hair, wash your hair first as usual with your normal  shampoo.  3.  After you shampoo, rinse your hair and body thoroughly to remove the shampoo.                             4.  Use CHG as you would any other liquid soap.  You can apply chg directly to the skin and wash.  Gently with a scrungie or clean washcloth.  5.  Apply the CHG Soap to your body ONLY FROM THE NECK DOWN.   Do   not use on face/ open                           Wound or open sores. Avoid contact with eyes, ears mouth and   genitals (private parts).                       Wash face,  Genitals  (private parts) with your normal soap.             6.  Wash thoroughly, paying special attention to the area where your    surgery  will be performed.  7.  Thoroughly rinse your body with warm water  from the neck down.  8.  DO NOT shower/wash with your normal soap after using and rinsing off the CHG Soap.                9.  Pat yourself  dry with a clean towel.            10.  Wear clean pajamas.            11.  Place clean sheets on your bed the night of your first shower and do not  sleep with pets. Day of Surgery : Do not apply any CHG, lotions/deodorants the morning of surgery.  Please wear clean clothes to the hospital/surgery center.  FAILURE TO FOLLOW THESE INSTRUCTIONS MAY RESULT IN THE CANCELLATION OF YOUR SURGERY  PATIENT SIGNATURE_________________________________  NURSE SIGNATURE__________________________________  ________________________________________________________________________

## 2024-09-26 ENCOUNTER — Other Ambulatory Visit: Payer: Self-pay

## 2024-09-26 ENCOUNTER — Encounter (HOSPITAL_COMMUNITY): Payer: Self-pay

## 2024-09-26 ENCOUNTER — Encounter (HOSPITAL_COMMUNITY)
Admission: RE | Admit: 2024-09-26 | Discharge: 2024-09-26 | Disposition: A | Source: Ambulatory Visit | Attending: Urology

## 2024-09-26 VITALS — BP 132/91 | HR 105 | Ht 67.0 in | Wt 185.0 lb

## 2024-09-26 DIAGNOSIS — I10 Essential (primary) hypertension: Secondary | ICD-10-CM | POA: Diagnosis not present

## 2024-09-26 DIAGNOSIS — Z01812 Encounter for preprocedural laboratory examination: Secondary | ICD-10-CM | POA: Diagnosis not present

## 2024-09-26 DIAGNOSIS — Z01818 Encounter for other preprocedural examination: Secondary | ICD-10-CM | POA: Diagnosis not present

## 2024-09-26 DIAGNOSIS — Z01810 Encounter for preprocedural cardiovascular examination: Secondary | ICD-10-CM | POA: Diagnosis not present

## 2024-09-26 HISTORY — DX: Depression, unspecified: F32.A

## 2024-09-26 HISTORY — DX: Palpitations: R00.2

## 2024-09-26 LAB — BASIC METABOLIC PANEL WITH GFR
Anion gap: 10 (ref 5–15)
BUN: 17 mg/dL (ref 8–23)
CO2: 27 mmol/L (ref 22–32)
Calcium: 10 mg/dL (ref 8.9–10.3)
Chloride: 103 mmol/L (ref 98–111)
Creatinine, Ser: 1.31 mg/dL — ABNORMAL HIGH (ref 0.44–1.00)
GFR, Estimated: 45 mL/min — ABNORMAL LOW (ref >60)
Glucose, Bld: 83 mg/dL (ref 70–99)
Potassium: 4.7 mmol/L (ref 3.5–5.1)
Sodium: 140 mmol/L (ref 135–145)

## 2024-09-26 LAB — CBC
HCT: 44.2 % (ref 36.0–46.0)
Hemoglobin: 14 g/dL (ref 12.0–15.0)
MCH: 29.4 pg (ref 26.0–34.0)
MCHC: 31.7 g/dL (ref 30.0–36.0)
MCV: 92.9 fL (ref 80.0–100.0)
Platelets: 129 K/uL — ABNORMAL LOW (ref 150–400)
RBC: 4.76 MIL/uL (ref 3.87–5.11)
RDW: 13.7 % (ref 11.5–15.5)
WBC: 4.6 K/uL (ref 4.0–10.5)
nRBC: 0 % (ref 0.0–0.2)

## 2024-09-26 NOTE — Progress Notes (Signed)
 For Anesthesia: PCP - Leonce Lucie PARAS, PA-C  Cardiologist - N/A  Bowel Prep reminder:  Chest x-ray -  EKG - 09/26/24 Stress Test -  ECHO -  Cardiac Cath -  Pacemaker/ICD device last checked: Pacemaker orders received: Device Rep notified:  Spinal Cord Stimulator:N/A  Sleep Study - N/A CPAP -   Fasting Blood Sugar - N/A Checks Blood Sugar _____ times a day Date and result of last Hgb A1c-  Last dose of GLP1 agonist- N/A GLP1 instructions: Hold 7 days prior to schedule (Hold 24 hours-daily)   Last dose of SGLT-2 inhibitors- N/A SGLT-2 instructions: Hold 72 hours prior to surgery  Blood Thinner Instructions:N/A Last Dose: Time last taken:  Aspirin Instructions:N/A Last Dose: Time last taken:  Activity level: Can go up a flight of stairs and activities of daily living without stopping and without chest pain and/or shortness of breath   Able to exercise without chest pain and/or shortness of breath  Anesthesia review: As per pt.,she has been tachycardic for two months now since she got bite by a tick.Pt. said PCP is aware of it.  Patient denies shortness of breath, fever, cough and chest pain at PAT appointment   Patient verbalized understanding of instructions that were reviewed over the telephone.

## 2024-10-06 ENCOUNTER — Encounter (HOSPITAL_COMMUNITY): Payer: Self-pay | Admitting: Urology

## 2024-10-06 MED ORDER — DEXTROSE 5 % IV SOLN
5.0000 mg/kg | INTRAVENOUS | Status: AC
  Administered 2024-10-07: 419.6 mg via INTRAVENOUS
  Filled 2024-10-06: qty 10.5

## 2024-10-07 ENCOUNTER — Other Ambulatory Visit: Payer: Self-pay

## 2024-10-07 ENCOUNTER — Ambulatory Visit (HOSPITAL_COMMUNITY)
Admission: RE | Admit: 2024-10-07 | Discharge: 2024-10-07 | Disposition: A | Discharge status: Home / Self Care | Payer: Self-pay | Source: Ambulatory Visit | Attending: Urology | Admitting: Urology

## 2024-10-07 ENCOUNTER — Ambulatory Visit (HOSPITAL_COMMUNITY)

## 2024-10-07 ENCOUNTER — Encounter (HOSPITAL_COMMUNITY)
Admission: RE | Disposition: A | Discharge status: Home / Self Care | Payer: Self-pay | Source: Ambulatory Visit | Attending: Urology

## 2024-10-07 ENCOUNTER — Ambulatory Visit (HOSPITAL_COMMUNITY): Payer: Self-pay | Admitting: Anesthesiology

## 2024-10-07 ENCOUNTER — Ambulatory Visit (HOSPITAL_COMMUNITY): Payer: Self-pay | Admitting: Medical

## 2024-10-07 ENCOUNTER — Encounter (HOSPITAL_COMMUNITY): Payer: Self-pay | Admitting: Urology

## 2024-10-07 DIAGNOSIS — J4489 Other specified chronic obstructive pulmonary disease: Secondary | ICD-10-CM | POA: Diagnosis not present

## 2024-10-07 DIAGNOSIS — C678 Malignant neoplasm of overlapping sites of bladder: Secondary | ICD-10-CM

## 2024-10-07 DIAGNOSIS — N2889 Other specified disorders of kidney and ureter: Secondary | ICD-10-CM | POA: Diagnosis present

## 2024-10-07 DIAGNOSIS — Z6828 Body mass index (BMI) 28.0-28.9, adult: Secondary | ICD-10-CM | POA: Diagnosis not present

## 2024-10-07 DIAGNOSIS — Z8249 Family history of ischemic heart disease and other diseases of the circulatory system: Secondary | ICD-10-CM | POA: Insufficient documentation

## 2024-10-07 DIAGNOSIS — C679 Malignant neoplasm of bladder, unspecified: Secondary | ICD-10-CM | POA: Diagnosis not present

## 2024-10-07 DIAGNOSIS — I10 Essential (primary) hypertension: Secondary | ICD-10-CM | POA: Diagnosis not present

## 2024-10-07 DIAGNOSIS — Z90710 Acquired absence of both cervix and uterus: Secondary | ICD-10-CM | POA: Insufficient documentation

## 2024-10-07 DIAGNOSIS — Z8541 Personal history of malignant neoplasm of cervix uteri: Secondary | ICD-10-CM | POA: Diagnosis not present

## 2024-10-07 DIAGNOSIS — N289 Disorder of kidney and ureter, unspecified: Secondary | ICD-10-CM | POA: Diagnosis not present

## 2024-10-07 DIAGNOSIS — N1330 Unspecified hydronephrosis: Secondary | ICD-10-CM | POA: Diagnosis not present

## 2024-10-07 DIAGNOSIS — Z923 Personal history of irradiation: Secondary | ICD-10-CM | POA: Diagnosis not present

## 2024-10-07 DIAGNOSIS — E669 Obesity, unspecified: Secondary | ICD-10-CM | POA: Insufficient documentation

## 2024-10-07 DIAGNOSIS — C7919 Secondary malignant neoplasm of other urinary organs: Secondary | ICD-10-CM | POA: Insufficient documentation

## 2024-10-07 DIAGNOSIS — Z87891 Personal history of nicotine dependence: Secondary | ICD-10-CM | POA: Insufficient documentation

## 2024-10-07 HISTORY — PX: TRANSURETHRAL RESECTION OF BLADDER TUMOR: SHX2575

## 2024-10-07 HISTORY — PX: CYSTOSCOPY W/ RETROGRADES: SHX1426

## 2024-10-07 SURGERY — TURBT (TRANSURETHRAL RESECTION OF BLADDER TUMOR)
Anesthesia: General | Site: Bladder

## 2024-10-07 MED ORDER — FENTANYL CITRATE (PF) 100 MCG/2ML IJ SOLN
INTRAMUSCULAR | Status: AC
  Filled 2024-10-07: qty 2

## 2024-10-07 MED ORDER — FENTANYL CITRATE (PF) 50 MCG/ML IJ SOSY
25.0000 ug | PREFILLED_SYRINGE | INTRAMUSCULAR | Status: DC | PRN
  Administered 2024-10-07 (×2): 50 ug via INTRAVENOUS

## 2024-10-07 MED ORDER — OXYCODONE HCL 5 MG/5ML PO SOLN: 5.0000 mg | Freq: Once | ORAL | Status: AC | PRN

## 2024-10-07 MED ORDER — SUGAMMADEX SODIUM 200 MG/2ML IV SOLN
INTRAVENOUS | Status: DC | PRN
  Administered 2024-10-07: 180 mg via INTRAVENOUS

## 2024-10-07 MED ORDER — OXYCODONE HCL 5 MG PO TABS
ORAL_TABLET | ORAL | Status: AC
  Filled 2024-10-07: qty 1

## 2024-10-07 MED ORDER — MIDAZOLAM HCL 2 MG/2ML IJ SOLN
INTRAMUSCULAR | Status: AC
  Filled 2024-10-07: qty 2

## 2024-10-07 MED ORDER — LIDOCAINE 2% (20 MG/ML) 5 ML SYRINGE
INTRAMUSCULAR | Status: DC | PRN
  Administered 2024-10-07: 100 mg via INTRAVENOUS

## 2024-10-07 MED ORDER — LIDOCAINE HCL (PF) 2 % IJ SOLN
INTRAMUSCULAR | Status: AC
  Filled 2024-10-07: qty 5

## 2024-10-07 MED ORDER — ROCURONIUM BROMIDE 10 MG/ML (PF) SYRINGE
PREFILLED_SYRINGE | INTRAVENOUS | Status: AC
  Filled 2024-10-07: qty 10

## 2024-10-07 MED ORDER — OXYCODONE HCL 5 MG PO TABS
5.0000 mg | ORAL_TABLET | Freq: Once | ORAL | Status: AC | PRN
  Administered 2024-10-07: 5 mg via ORAL

## 2024-10-07 MED ORDER — SENNOSIDES-DOCUSATE SODIUM 8.6-50 MG PO TABS: 1.0000 | ORAL_TABLET | Freq: Two times a day (BID) | ORAL | 0 refills | Status: AC

## 2024-10-07 MED ORDER — FENTANYL CITRATE (PF) 100 MCG/2ML IJ SOLN
INTRAMUSCULAR | Status: DC | PRN
  Administered 2024-10-07 (×2): 50 ug via INTRAVENOUS
  Administered 2024-10-07: 25 ug via INTRAVENOUS

## 2024-10-07 MED ORDER — ONDANSETRON HCL 4 MG/2ML IJ SOLN
INTRAMUSCULAR | Status: AC
  Filled 2024-10-07: qty 2

## 2024-10-07 MED ORDER — IOHEXOL 300 MG/ML  SOLN
INTRAMUSCULAR | Status: DC | PRN
  Administered 2024-10-07: 70 mL

## 2024-10-07 MED ORDER — DEXAMETHASONE SOD PHOSPHATE PF 10 MG/ML IJ SOLN
INTRAMUSCULAR | Status: DC | PRN
  Administered 2024-10-07: 5 mg via INTRAVENOUS

## 2024-10-07 MED ORDER — DROPERIDOL 2.5 MG/ML IJ SOLN
INTRAMUSCULAR | Status: AC
  Filled 2024-10-07: qty 2

## 2024-10-07 MED ORDER — OXYCODONE-ACETAMINOPHEN 5-325 MG PO TABS: 1.0000 | ORAL_TABLET | Freq: Four times a day (QID) | ORAL | 0 refills | Status: DC | PRN

## 2024-10-07 MED ORDER — FENTANYL CITRATE (PF) 50 MCG/ML IJ SOSY
PREFILLED_SYRINGE | INTRAMUSCULAR | Status: AC
  Filled 2024-10-07: qty 2

## 2024-10-07 MED ORDER — ONDANSETRON HCL 4 MG/2ML IJ SOLN
INTRAMUSCULAR | Status: DC | PRN
  Administered 2024-10-07: 4 mg via INTRAVENOUS

## 2024-10-07 MED ORDER — ROCURONIUM BROMIDE 10 MG/ML (PF) SYRINGE
PREFILLED_SYRINGE | INTRAVENOUS | Status: DC | PRN
  Administered 2024-10-07: 60 mg via INTRAVENOUS

## 2024-10-07 MED ORDER — ACETAMINOPHEN 10 MG/ML IV SOLN
INTRAVENOUS | Status: AC
  Filled 2024-10-07: qty 100

## 2024-10-07 MED ORDER — ORAL CARE MOUTH RINSE: 15.0000 mL | Freq: Once | OROMUCOSAL | Status: AC

## 2024-10-07 MED ORDER — DROPERIDOL 2.5 MG/ML IJ SOLN
0.6250 mg | Freq: Once | INTRAMUSCULAR | Status: AC | PRN
  Administered 2024-10-07: 0.625 mg via INTRAVENOUS

## 2024-10-07 MED ORDER — ONDANSETRON HCL 4 MG/2ML IJ SOLN: 4.0000 mg | Freq: Once | INTRAMUSCULAR | Status: DC | PRN

## 2024-10-07 MED ORDER — SUGAMMADEX SODIUM 200 MG/2ML IV SOLN
INTRAVENOUS | Status: AC
  Filled 2024-10-07: qty 2

## 2024-10-07 MED ORDER — LACTATED RINGERS IV SOLN: INTRAVENOUS | Status: DC

## 2024-10-07 MED ORDER — SODIUM CHLORIDE 0.9 % IR SOLN
Status: DC | PRN
  Administered 2024-10-07: 6000 mL via INTRAVESICAL
  Administered 2024-10-07 (×3): 3000 mL via INTRAVESICAL

## 2024-10-07 MED ORDER — CHLORHEXIDINE GLUCONATE 0.12 % MT SOLN
15.0000 mL | Freq: Once | OROMUCOSAL | Status: AC
  Administered 2024-10-07: 15 mL via OROMUCOSAL

## 2024-10-07 MED ORDER — ACETAMINOPHEN 10 MG/ML IV SOLN
1000.0000 mg | Freq: Once | INTRAVENOUS | Status: DC | PRN
  Administered 2024-10-07: 1000 mg via INTRAVENOUS

## 2024-10-07 MED ORDER — MIDAZOLAM HCL 5 MG/5ML IJ SOLN
INTRAMUSCULAR | Status: DC | PRN
  Administered 2024-10-07: 1 mg via INTRAVENOUS

## 2024-10-07 MED ADMIN — Phenylephrine HCl IV Soln 10 MG/ML: 40 ug | INTRAVENOUS | NDC 00641614225

## 2024-10-07 MED ADMIN — Phenylephrine HCl IV Soln 10 MG/ML: 80 ug | INTRAVENOUS | NDC 00641614225

## 2024-10-07 MED ADMIN — PROPOFOL 200 MG/20ML IV EMUL: 20 mg | INTRAVENOUS | NDC 00069020910

## 2024-10-07 MED ADMIN — PROPOFOL 200 MG/20ML IV EMUL: 100 mg | INTRAVENOUS | NDC 00069020910

## 2024-10-07 MED ADMIN — PROPOFOL 200 MG/20ML IV EMUL: 50 mg | INTRAVENOUS | NDC 00069020910

## 2024-10-07 SURGICAL SUPPLY — 21 items
BAG URINE DRAIN 2000ML AR STRL (UROLOGICAL SUPPLIES) IMPLANT
BAG URO CATCHER STRL LF (MISCELLANEOUS) ×2 IMPLANT
BASKET LASER NITINOL 1.9FR (BASKET) IMPLANT
CATH URETL OPEN END 6FR 70 (CATHETERS) IMPLANT
CLOTH BEACON ORANGE TIMEOUT ST (SAFETY) ×2 IMPLANT
DRAPE FOOT SWITCH (DRAPES) ×2 IMPLANT
ELECT REM PT RETURN 15FT ADLT (MISCELLANEOUS) ×2 IMPLANT
FORCEPS BIOP 2.4F 115CM BACKLD (INSTRUMENTS) IMPLANT
GLOVE SURG LX STRL 7.5 STRW (GLOVE) ×2 IMPLANT
GOWN STRL REUS W/ TWL XL LVL3 (GOWN DISPOSABLE) ×2 IMPLANT
GUIDEWIRE STR DUAL SENSOR (WIRE) ×2 IMPLANT
KIT TURNOVER KIT A (KITS) ×2 IMPLANT
LOOP CUT BIPOLAR 24F LRG (ELECTROSURGICAL) IMPLANT
MANIFOLD NEPTUNE II (INSTRUMENTS) ×2 IMPLANT
NS IRRIG 1000ML POUR BTL (IV SOLUTION) IMPLANT
PACK CYSTO (CUSTOM PROCEDURE TRAY) ×2 IMPLANT
SHEATH NAVIGATOR HD 11/13X28 (SHEATH) IMPLANT
STENT CONTOUR 7FRX24X.038 (STENTS) IMPLANT
SYRINGE TOOMEY IRRIG 70ML (MISCELLANEOUS) IMPLANT
TUBING CONNECTING 10 (TUBING) ×2 IMPLANT
TUBING UROLOGY SET (TUBING) ×2 IMPLANT

## 2024-10-07 NOTE — Transfer of Care (Signed)
 Immediate Anesthesia Transfer of Care Note  Patient: [name]  Procedure(s) Performed: TURBT (TRANSURETHRAL RESECTION OF BLADDER TUMOR) CYSTOSCOPY, LEFT URETEROSCOPY WITH BIOPSY, WITH BILATERAL RETROGRADE PYELOGRAMS WITH LEFT STENT PLACEMENT (Bilateral: Bladder)  Patient Location: PACU  Anesthesia Type:General  Level of Consciousness: drowsy and patient cooperative  Airway & Oxygen Therapy: Patient Spontanous Breathing and Patient connected to face mask  Post-op Assessment: Report given to RN and Post -op Vital signs reviewed and stable  Post vital signs: Reviewed and stable  Last Vitals:  Vitals Value Taken Time  BP 156/89 10/07/24 11:35  Temp    Pulse 85 10/07/24 11:41  Resp 19 10/07/24 11:41  SpO2 100 % 10/07/24 11:41  Vitals shown include unfiled device data.  Last Pain:  Vitals:   10/07/24 0833  TempSrc: Oral  PainSc:          Complications: No notable events documented.

## 2024-10-07 NOTE — Anesthesia Preprocedure Evaluation (Addendum)
 Anesthesia Evaluation  Patient identified by MRN, date of birth, ID band Patient awake    Reviewed: Allergy & Precautions, NPO status , Patient's Chart, lab work & pertinent test results  History of Anesthesia Complications Negative for: history of anesthetic complications  Airway Mallampati: II       Dental  (+) Edentulous Upper, Edentulous Lower   Pulmonary former smoker   breath sounds clear to auscultation       Cardiovascular hypertension,  Rhythm:Regular Rate:Normal     Neuro/Psych    GI/Hepatic   Endo/Other    Renal/GU Renal InsufficiencyRenal diseaseBaseline Cr ~1.3     Musculoskeletal   Abdominal   Peds  Hematology   Anesthesia Other Findings   Reproductive/Obstetrics                              Anesthesia Physical Anesthesia Plan  ASA: 2  Anesthesia Plan: General   Post-op Pain Management:    Induction: Intravenous  PONV Risk Score and Plan: 2 and Ondansetron , Dexamethasone  and Treatment may vary due to age or medical condition  Airway Management Planned: Oral ETT  Additional Equipment:   Intra-op Plan:   Post-operative Plan: Extubation in OR  Informed Consent:      Dental advisory given  Plan Discussed with: CRNA and Surgeon  Anesthesia Plan Comments:          Anesthesia Quick Evaluation

## 2024-10-07 NOTE — Op Note (Unsigned)
 [name], [name] MEDICAL RECORD NO: [id number] ACCOUNT NO: [id number] DATE OF BIRTH: [birth year] FACILITY: [facility] LOCATION: [facility] PHYSICIAN: Ricardo Likens, MD  Operative Report   DATE OF PROCEDURE: 10/07/2024  PREOPERATIVE DIAGNOSES:  Recurrent bladder cancer.  POSTOPERATIVE DIAGNOSES: 1.  Recurrent bladder cancer. 2.  Left ureteral mass.  PROCEDURE PERFORMED: 1.  Cystoscopy and bilateral retrograde pyelograms interpretation. 2.  Left ureteroscopy with biopsy. 3.  Insertion of left ureteral stent. 4.  Transurethral resection of bladder tumor, volume large.  ESTIMATED BLOOD LOSS:  50 mL.  COMPLICATIONS:  None.  SPECIMENS: 1.  Left ureteral mass for permanent pathology. 2.  Recurrent bladder tumor for permanent pathology.  FINDINGS: 1.  Multifocal large volume papillary bladder tumor in all quadrants, approximately 7-8 cm2. 2.  Bilateral mild hydronephrosis. 3.  Left midureteral mass approximately 4 cm above the iliac crossing concerning for separate foci of tumor.  This was biopsied. 4.  Excellent placement of left ureteral stent proximal end in the renal pelvis and distal end in the urinary bladder.  INDICATIONS:  The patient is a very pleasant but unfortunate 63 year old lady with a remote history of cervical cancer status post radiation and surgery years ago.  She subsequently developed bladder cancer that has been high-grade, muscle invasive, and  recurrent.  With a complex history, she has declined cystectomy and has elected to manage as per stage I, understanding this is not curative intent.  She was found on recent surveillance and workup evaluation of recurrent hematuria to have significant  volume recurrence of bladder tumor.  Also, risks were discussed, including the recommended path of transurethral resection for therapeutic and palliative intent, and she presented for the same.  Informed consent was obtained and placed in the medical  record.  DESCRIPTION OF  PROCEDURE:  The patient being identified, verified and the procedure being cystoscopy with bilateral retrogrades and transurethral resection of bladder tumor was confirmed.  Procedure time out was performed.  Intravenous antibiotics were  administered.  General endotracheal anesthesia was induced.  The patient was placed into low lithotomy position and a sterile field was created, prepped and draped the patient's vagina, introitus and proximal thighs using iodine.  Cystourethroscopy was  performed using a 21-French rigid cystoscope with offset lens.  Inspection of bladder revealed significant amount of formed clot in the bladder.  Multifocal papillary tumors in all quadrants.  Dominant focus being up the proximally 10 o'clock position of  the bladder dome.  The ureteral orifice was somewhat scarified and laterally displaced but visibly patent.  The right ureteral orifice was cannulated with 6-french end-hole catheter and a right retrograde pyelogram was obtained.  The right retrograde pyelogram demonstrated a single right ureter, single system right kidney.  There is mild to moderate hydronephrosis noted that was chronic in nature.  No filling defects were noted.  Next, a left retrograde pyelogram was obtained.  Left retrograde pyelogram demonstrated single left ureter and single system left kidney.  There was immediately noted to be a questionable filling defect in the left mid ureter that initially was concerned  may represent iliac notch versus a separate mass.  There was moderate hydronephrosis above this point.  A 0.038 Sensor wire was advanced to the level of the upper pole.  A single channel distal ureteroscope was advanced over this to allow visualization  of the area in question.  This did appear to be most consistent with an intraluminal mass.  This was unfortunate.  A Sensor wire was once again advanced  and a short-length ureteral access sheath was advanced to a level approximately 3-4 cm below  the left  ureteral lesion.  Using ureteroscopic vision, the BIGopsy apparatus was used to obtain a representative biopsy of this intraluminal mass.  This was set aside and labeled as such.  This did appear to be partially obstructing.  A sensor wire was once  again advanced.  The access sheath was removed and a new 7 x 24 contour-type stent was carefully placed using fluoroscopic guidance.  Good proximal and distal plane were noted.  The cystoscope was then exchanged for a 26-French resectoscope with a visual  obturator.  Using the resectoscope loop, very careful dissection was performed down to the superficial fibromuscular stroma of the urinary bladder at all foci of a papillary bladder tumor.  Again, total surface area was at least 8 cm2 with the dominant  focus being at approximately the 10 o'clock position of the bladder neck.  This generated many small bladder tumor fragments that were irrigated set aside for permanent pathology.  Given the patient already has known muscle-invasive disease, it was not  felt that deep biopsies would be warranted as this did not change her management but only increases risk.  We achieved the goals of addressing all visible bladder tumor.  Hemostasis was quite acceptable considering the volume and given the multifocality,  I felt that catheterization would actually promote further bleeding.  Therefore, a decision was made not to place the catheter.  The bladder was partially emptied per cystoscope.  Procedure then terminated.  The patient tolerated the procedure well.  No  immediate periprocedural complications.  The patient was taken to postanesthesia care unit in stable condition with plan for discharge home.   PUS D: 10/07/2024 11:28:02 am T: 10/07/2024 2:32:00 pm  JOB: [id number]/ [id number]

## 2024-10-07 NOTE — Discharge Instructions (Signed)
1 - You may have urinary urgency (bladder spasms) and bloody urine on / off for up to 2 weeks.  This is normal.  2 - Call MD or go to ER for fever >102, severe pain / nausea / vomiting not relieved by medications, or acute change in medical status  

## 2024-10-07 NOTE — Brief Op Note (Signed)
 10/07/2024  11:21 AM  PATIENT:  [name]  63 y.o. female  PRE-OPERATIVE DIAGNOSIS:  RECUR BLADDER CANCER  POST-OPERATIVE DIAGNOSIS:  RECUR BLADDER CANCER + :LEFT URETERAL MASS  PROCEDURE:  Procedure(s) with comments: TURBT (TRANSURETHRAL RESECTION OF BLADDER TUMOR) (N/A) - LARGE CYSTOSCOPY, LEFT URETEROSCOPY WITH BIOPSY, WITH BILATERAL RETROGRADE PYELOGRAMS WITH LEFT STENT PLACEMENT (Bilateral)  SURGEON:  Surgeons and Role:    * Manny, Ricardo KATHEE Raddle., MD - Primary  PHYSICIAN ASSISTANT:   ASSISTANTS: none   ANESTHESIA:   general  EBL:  minimal   BLOOD ADMINISTERED:none  DRAINS: none   LOCAL MEDICATIONS USED:  NONE  SPECIMEN:  Source of Specimen:  left ureteral mass, recurrent bladder cancer  DISPOSITION OF SPECIMEN:  PATHOLOGY  COUNTS:  YES  TOURNIQUET:  * No tourniquets in log *  DICTATION: .Other Dictation: Dictation Number [id number]  PLAN OF CARE: Discharge to home after PACU  PATIENT DISPOSITION:  PACU - hemodynamically stable.   Delay start of Pharmacological VTE agent (>24hrs) due to surgical blood loss or risk of bleeding: yes

## 2024-10-07 NOTE — H&P (Signed)
 [name] is an 63 y.o. female.    Chief Complaint: Pre-OP Transurethral Resection of Bladder Tumor  HPI:   1 - High Grade Bladder Cancer - T1/2 high grade urothlial carcinoma by TURBT 08/2022 by Dr. Sherrilee. CT localized, singl ureters bilterally. She has h/o TAH/XRT/node dissection for h/o cervical cancer. Remote 20PY smoker. Cr 2's per report and not cisplatinum candidate per medical oncology. Not candidate for signifciant pelvic radiation given already high dose in area. There was some questions on prior path if truly muscle invasive.   Recent Course:  10/2022 - Restaging TURBT - CIS only, muscle not involved with aggressive deep sampling ==> Induction BCG x 6  12/2023 - TURBT - T1G3, large volume CIS, negative muscle ==> declines cystectomy or keytruda  05/2024 - cysto small dome and Rt trigone area recurrence ( flat, papillary, abtou 4cm total), 08/2024 cysto progression of Rt trigone / dome (7cm total, with some papillary changes)   PMH sig for mild obesity, TAH/XRT/Node Dissection for cervical cancer (pfannensteil) ,COPD (not limiting, quit smoking), C spine fusion. Her husband [name] is very involved. Her PCP is Daisy Mace PA   Today [name] is seen to proceed with transurethral resection of recurrent bladder cancer. No interval fevers. Most recent UCX non-clonal. Hgb 14, Cr 1.3 most recently.    Past Medical History:  Diagnosis Date   Asthma    rarely uses   Cancer [facility]) 07/2022   Bladder cancer, treated with 6 treatments of BCG, follows w/ [facility].   Cervical cancer (HCC) 1992   treated w/ hysterectomy and radiation   Depression    GERD (gastroesophageal reflux disease)    no longer suffering from   Hyperlipidemia    Hypertension    Follows w/ Daisy Mace, PA.   IFG (impaired fasting glucose)    Neuromuscular disorder (HCC) 2023   Patient has numbness and tingling in arms and hands from back surgery.   Obesity    Palpitations    Peripheral vertigo     Pneumonia    about 20 years ago per pt on 01/08/24   Thyroid disease    hx of many years ago   Wears dentures    upper and lower   Wears glasses     Past Surgical History:  Procedure Laterality Date   ABDOMINAL HYSTERECTOMY  1992   ANTERIOR CERVICAL DECOMP/DISCECTOMY FUSION N/A 09/30/2022   Procedure: CERVICAL FOUR-FIVE, CERVICAL FIVE-SIX, CERVICAL SIX-SEVEN ANTERIOR CERVICAL DECOMPRESSION/DISCECTOMY FUSION;  Surgeon: Cheryle Debby LABOR, MD;  Location: [facility];  Service: Neurosurgery;  Laterality: N/A;   APPENDECTOMY  1992   CYSTOSCOPY  01/13/2024   Procedure: CYSTOSCOPY;  Surgeon: Alvaro Ricardo KATHEE Mickey., MD;  Location: [facility];  Service: Urology;;   CYSTOSCOPY W/ RETROGRADES Bilateral 08/26/2022   Procedure: CYSTOSCOPY WITH RETROGRADE PYELOGRAM;  Surgeon: Sherrilee Belvie CROME, MD;  Location: [facility];  Service: Urology;  Laterality: Bilateral;   CYSTOSCOPY W/ RETROGRADES Bilateral 11/07/2022   Procedure: CYSTOSCOPY WITH RETROGRADE PYELOGRAM;  Surgeon: Alvaro Ricardo, MD;  Location: [facility];  Service: Urology;  Laterality: Bilateral;   TRANSURETHRAL RESECTION OF BLADDER TUMOR N/A 08/26/2022   Procedure: TRANSURETHRAL RESECTION OF BLADDER TUMOR (TURBT);  Surgeon: Sherrilee Belvie CROME, MD;  Location: [facility];  Service: Urology;  Laterality: N/A;   TRANSURETHRAL RESECTION OF BLADDER TUMOR N/A 11/07/2022   Procedure: TRANSURETHRAL RESECTION OF BLADDER TUMOR (TURBT);  Surgeon: Alvaro Ricardo, MD;  Location: [facility];  Service: Urology;  Laterality: N/A;  1 HR   TRANSURETHRAL RESECTION OF BLADDER TUMOR Bilateral 01/13/2024   Procedure: TRANSURETHRAL RESECTION OF BLADDER TUMOR (TURBT) BILATERAL RETROGRADE PYELOGRAM;  Surgeon: Alvaro Ricardo KATHEE Mickey., MD;  Location: [facility];  Service: Urology;  Laterality: Bilateral;    Family History  Problem Relation Age of Onset   Cancer Mother        colon   Stroke Mother    Heart disease  Father    Heart disease Maternal Grandmother    Cancer Maternal Grandfather        colon   Cancer Son        Leukemia   Social History:  reports that she quit smoking about 33 years ago. Her smoking use included cigarettes. She has never used smokeless tobacco. She reports that she does not drink alcohol and does not use drugs.  Allergies:  Allergies  Allergen Reactions   Augmentin [Amoxicillin-Pot Clavulanate] Diarrhea and Nausea And Vomiting    Pt decreased dose to QD HS and tolerated well    No medications prior to admission.    No results found for this or any previous visit (from the past 48 hours). No results found.  Review of Systems  Constitutional:  Negative for chills and fever.  Genitourinary:  Positive for hematuria.  All other systems reviewed and are negative.   Last menstrual period 02/05/1991. Physical Exam Vitals reviewed.  HENT:     Head: Normocephalic.  Eyes:     Pupils: Pupils are equal, round, and reactive to light.  Cardiovascular:     Rate and Rhythm: Normal rate.  Pulmonary:     Effort: Pulmonary effort is normal.  Abdominal:     General: Abdomen is flat.  Genitourinary:    Comments: No CVAT at present Musculoskeletal:        General: Normal range of motion.     Cervical back: Normal range of motion.  Skin:    General: Skin is warm.  Neurological:     General: No focal deficit present.     Mental Status: She is alert.  Psychiatric:        Mood and Affect: Mood normal.      Assessment/Plan  Proceed as planned with TURBT/Retrogrades for recurrent bladder cancer. Risks, benefits, alternatives, expected peri-op course discussed previously and reiterated today.   [name]., MD 10/07/2024, 6:50 AM

## 2024-10-07 NOTE — Anesthesia Procedure Notes (Signed)
 Procedure Name: Intubation Date/Time: 10/07/2024 9:45 AM  Performed by: Vincenzo Show, CRNAPre-anesthesia Checklist: Patient identified, Emergency Drugs available, Suction available, Patient being monitored and Timeout performed Patient Re-evaluated:Patient Re-evaluated prior to induction Oxygen Delivery Method: Circle system utilized Preoxygenation: Pre-oxygenation with 100% oxygen Induction Type: IV induction Ventilation: Mask ventilation without difficulty Laryngoscope Size: Mac and 3 Grade View: Grade I Tube type: Oral Tube size: 7.0 mm Number of attempts: 1 Airway Equipment and Method: Stylet Placement Confirmation: ETT inserted through vocal cords under direct vision, positive ETCO2, CO2 detector and breath sounds checked- equal and bilateral Secured at: 22 cm Tube secured with: Tape Dental Injury: Teeth and Oropharynx as per pre-operative assessment

## 2024-10-10 ENCOUNTER — Encounter (HOSPITAL_COMMUNITY): Payer: Self-pay | Admitting: Urology

## 2024-10-10 LAB — SURGICAL PATHOLOGY

## 2024-10-10 NOTE — Anesthesia Postprocedure Evaluation (Signed)
 Anesthesia Post Note  Patient: [name]  Procedure(s) Performed: TURBT (TRANSURETHRAL RESECTION OF BLADDER TUMOR) CYSTOSCOPY, LEFT URETEROSCOPY WITH BIOPSY, WITH BILATERAL RETROGRADE PYELOGRAMS WITH LEFT STENT PLACEMENT (Bilateral: Bladder)     Patient location during evaluation: PACU Anesthesia Type: General Level of consciousness: awake Pain management: pain level controlled Vital Signs Assessment: post-procedure vital signs reviewed and stable Respiratory status: spontaneous breathing Postop Assessment: no apparent nausea or vomiting Anesthetic complications: no   No notable events documented.  Last Vitals:  Vitals:   10/07/24 1230 10/07/24 1258  BP: 131/84 (!) 145/88  Pulse: 75 91  Resp: 12 18  Temp:  36.4 C  SpO2: 98% 94%    Last Pain:  Vitals:   10/07/24 1258  TempSrc: Oral  PainSc: 3                  Lauraine KATHEE [place]

## 2024-10-24 DIAGNOSIS — R399 Unspecified symptoms and signs involving the genitourinary system: Secondary | ICD-10-CM | POA: Diagnosis not present

## 2024-10-24 DIAGNOSIS — C662 Malignant neoplasm of left ureter: Secondary | ICD-10-CM | POA: Diagnosis not present

## 2024-10-24 DIAGNOSIS — C673 Malignant neoplasm of anterior wall of bladder: Secondary | ICD-10-CM | POA: Diagnosis not present

## 2024-11-02 DIAGNOSIS — Z1211 Encounter for screening for malignant neoplasm of colon: Secondary | ICD-10-CM | POA: Diagnosis not present

## 2024-11-06 NOTE — Progress Notes (Unsigned)
 [facility] CONSULT NOTE  Patient Care Team: Leonce Lucie [name] as PCP - General (Family Medicine) Daphane Rosella, NP (Inactive) as PCP - Family Medicine (Nurse Practitioner) Cindie Carlin POUR, DO as Consulting Physician (Internal Medicine)  Addendum After Ronal went home and consider her options.  She contacted and elected proceed with intravesical gemcitabine and docetaxel.  Plan entered and will call for scheduling accordingly.  I well communicate with her urologist.  ASSESSMENT & PLAN:  Rajah is a 63 y.o.female with history of hypertension, asthma, cervical myelopathy, cervical cancer s/p hysterectomy and radiation [facility] recurrent NMIBC.  Diagnosis: recurrent NMIBC, plasmacytoid feature and history of poorly differentiated urothelial carcinoma. Induction BCG x 6 in 2023.  Most recent pathology from 10/07/2024 showed HG UC with plasmacytoid feature, carcinoma invades lamina propria muscularis mucosa.  Muscularis propria is present and not involved.  Creat clearance is 54.  We discuss options for high-risk post BCG NMIBC. Discussed radical cystectomy is one potential curative option and avoid additional cystoscopy surveillance in the future. One concern for other non-curative intervention is pathologic upstaging to muscle-invasive or metastatic disease occurs in over 30% of patients with clinically high-risk NMIBC (especially T1), and studies showed 8-15% have lymph node metastases at the time of cystectomy. Among patients underwent cystectomy with pathologically confirmed NMIBC (no muscle invasion or nodal disease), the risk of developing distant metastases after surgery is generally <5% at 5 years.  Intravesical gemcitabine and docetaxel has been reporting of effective from retrospective study.  1 year progression free reported over over 50%.  Side effects mostly local in the bladder including dysuria, burning, frequency and  UTI.  For immunotherapy with pembrolizumab with Ta or T1 without Tis, 25-month disease-free survival was 435%. Treatment-related adverse events occurred was almost 3 out of 4 patients and 14% had a grade 3 or 4 treatment-related adverse event.   Intravesical docetaxel and gemcitabine therapy can be considered.  Retrospective study showed response likely sustained over 1 year.  1 year recurrence free survival was over 60%.    Nadofaragene firadenovec: 8-month high-grade recurrence-free survival of approximately 44% in BCG-unresponsive T1 or Ta disease.  We do not have it available here.  Nogapendekin alfa inbakicept (Anktiva) + BCG: For BCG-unresponsive T1/Ta NMIBC 12 month DFS was 54%. We do not have it available here.  Assessment & Plan Malignant neoplasm of urinary bladder, unspecified site [facility]) Waynetta will go home and consider options and let us  know I will communicate with her urologist.   [name], MD 11/20/20258:25 AM  CHIEF COMPLAINTS/PURPOSE OF CONSULTATION:  Recurrent bladder cancer  HISTORY OF PRESENTING ILLNESS:  MAYLEIGH TETRAULT 63 y.o. female is here because of recurrent bladder cancer. I have reviewed her chart and materials related to her cancer extensively and collaborated history with the patient. Summary of oncologic history is as follows:  Patient has history of recurrent high-grade UC.  Initially underwent TURBT in 08/2022.  Report of T1/2 high-grade urothelial carcinoma from outside records.  In November 2023 patient underwent restaging TURBT and found CIS only.  Muscle was not involved with aggressive disease sampling.  Patient with induction BCG x 6.  Report of doing well until 12/2023.  TURBT showed large volume CIS, negative muscle involvement.  Declined cystectomy on Keytruda.  In 09/2024, TURBT showed high-grade UC with plasmacytoid feature.  The carcinoma invades lamina propria, muscularis mucosa.  Muscularis propria or detrusor muscle is present and not  involved by carcinoma. Left  ureteral biopsy showed invasive high-grade urothelial carcinoma with plasmacytoid feature.  Carcinoma invades lamina propria.  Muscularis is not present.   Patient was referred here to discuss options including Keytruda before radical cystectomy.  MEDICAL HISTORY:  Past Medical History:  Diagnosis Date   Asthma    rarely uses   Cancer [facility]) 07/2022   Bladder cancer, treated with 6 treatments of BCG, follows w/ [facility].   Cervical cancer (HCC) 1992   treated w/ hysterectomy and radiation   Depression    GERD (gastroesophageal reflux disease)    no longer suffering from   Hyperlipidemia    Hypertension    Follows w/ Lucie Mace, PA.   IFG (impaired fasting glucose)    Neuromuscular disorder (HCC) 2023   Patient has numbness and tingling in arms and hands from back surgery.   Obesity    Palpitations    Peripheral vertigo    Pneumonia    about 20 years ago per pt on 01/08/24   Thyroid disease    hx of many years ago   Wears dentures    upper and lower   Wears glasses     SURGICAL HISTORY: Past Surgical History:  Procedure Laterality Date   ABDOMINAL HYSTERECTOMY  1992   ANTERIOR CERVICAL DECOMP/DISCECTOMY FUSION N/A 09/30/2022   Procedure: CERVICAL FOUR-FIVE, CERVICAL FIVE-SIX, CERVICAL SIX-SEVEN ANTERIOR CERVICAL DECOMPRESSION/DISCECTOMY FUSION;  Surgeon: Cheryle Debby LABOR, MD;  Location: [facility];  Service: Neurosurgery;  Laterality: N/A;   APPENDECTOMY  1992   CYSTOSCOPY  01/13/2024   Procedure: CYSTOSCOPY;  Surgeon: Alvaro Ricardo KATHEE Mickey., MD;  Location[facility];  Service: Urology;;   CYSTOSCOPY W/ RETROGRADES Bilateral 08/26/2022   Procedure: CYSTOSCOPY WITH RETROGRADE PYELOGRAM;  Surgeon: Sherrilee Belvie CROME, MD;  Location: [facility];  Service: Urology;  Laterality: Bilateral;   CYSTOSCOPY W/ RETROGRADES Bilateral 11/07/2022   Procedure: CYSTOSCOPY WITH RETROGRADE PYELOGRAM;  Surgeon: Alvaro Ricardo, MD;   Location: [facility];  Service: Urology;  Laterality: Bilateral;   CYSTOSCOPY W/ RETROGRADES Bilateral 10/07/2024   Procedure: CYSTOSCOPY, LEFT URETEROSCOPY WITH BIOPSY, WITH BILATERAL RETROGRADE PYELOGRAMS WITH LEFT STENT PLACEMENT;  Surgeon: Alvaro Ricardo KATHEE Mickey., MD;  Location: [facility];  Service: Urology;  Laterality: Bilateral;   TRANSURETHRAL RESECTION OF BLADDER TUMOR N/A 08/26/2022   Procedure: TRANSURETHRAL RESECTION OF BLADDER TUMOR (TURBT);  Surgeon: Sherrilee Belvie CROME, MD;  Location: [facility];  Service: Urology;  Laterality: N/A;   TRANSURETHRAL RESECTION OF BLADDER TUMOR N/A 11/07/2022   Procedure: TRANSURETHRAL RESECTION OF BLADDER TUMOR (TURBT);  Surgeon: Alvaro Ricardo, MD;  Location: [facility];  Service: Urology;  Laterality: N/A;  1 HR   TRANSURETHRAL RESECTION OF BLADDER TUMOR Bilateral 01/13/2024   Procedure: TRANSURETHRAL RESECTION OF BLADDER TUMOR (TURBT) BILATERAL RETROGRADE PYELOGRAM;  Surgeon: Alvaro Ricardo KATHEE Mickey., MD;  Location: [facility];  Service: Urology;  Laterality: Bilateral;   TRANSURETHRAL RESECTION OF BLADDER TUMOR N/A 10/07/2024   Procedure: TURBT (TRANSURETHRAL RESECTION OF BLADDER TUMOR);  Surgeon: Alvaro Ricardo KATHEE Mickey., MD;  Location: [facility];  Service: Urology;  Laterality: N/A;  LARGE    SOCIAL HISTORY: Social History   Socioeconomic History   Marital status: Married    Spouse name: Evalene   Number of children: 1   Years of education: Not on file   Highest education level: Bachelor's degree (e.g., BA, AB, BS)  Occupational History   Not on file  Tobacco Use   Smoking status: Former    Current  packs/day: 0.00    Types: Cigarettes    Quit date: 82    Years since quitting: 33.9   Smokeless tobacco: Never   Tobacco comments:    Smoked about 20 years, 1 -2 packs per day  Vaping Use   Vaping status: Never Used  Substance and Sexual Activity   Alcohol use: No    Alcohol/week: 0.0 standard drinks  of alcohol   Drug use: No   Sexual activity: Yes    Birth control/protection: Surgical    Comment: hysterectomy  Other Topics Concern   Not on file  Social History Narrative   Lives with husband   R handed   Caffeine: 2 C of coffee a day   Social Drivers of Corporate Investment Banker Strain: Not on file  Food Insecurity: No Food Insecurity (11/07/2024)   Hunger Vital Sign    Worried About Running Out of Food in the Last Year: Never true    Ran Out of Food in the Last Year: Never true  Transportation Needs: No Transportation Needs (11/07/2024)   PRAPARE - Administrator, Civil Service (Medical): No    Lack of Transportation (Non-Medical): No  Physical Activity: Not on file  Stress: Not on file  Social Connections: Not on file  Intimate Partner Violence: Not At Risk (11/07/2024)   Humiliation, Afraid, Rape, and Kick questionnaire    Fear of Current or Ex-Partner: No    Emotionally Abused: No    Physically Abused: No    Sexually Abused: No    FAMILY HISTORY: Family History  Problem Relation Age of Onset   Cancer Mother        colon   Stroke Mother    Heart disease Father    Heart disease Maternal Grandmother    Cancer Maternal Grandfather        colon   Cancer Son        Leukemia    ALLERGIES:  is allergic to augmentin [amoxicillin-pot clavulanate].  MEDICATIONS:  Current Outpatient Medications  Medication Sig Dispense Refill   acetaminophen  (TYLENOL ) 325 MG tablet Take 650 mg by mouth 2 (two) times daily as needed for mild pain.     albuterol  (VENTOLIN  HFA) 108 (90 Base) MCG/ACT inhaler Inhale 1-2 puffs into the lungs every 6 (six) hours as needed for wheezing or shortness of breath.     hydrochlorothiazide  (HYDRODIURIL ) 25 MG tablet Take 25 mg by mouth daily. (Patient not taking: Reported on 11/07/2024)     ondansetron  (ZOFRAN ) 8 MG tablet Take 1 tablet (8 mg total) by mouth every 8 (eight) hours as needed for nausea or vomiting. 30 tablet 1    phenazopyridine  (PYRIDIUM ) 200 MG tablet Take 1 tablet (200 mg total) by mouth 3 (three) times daily as needed for pain. 10 tablet 0   prochlorperazine (COMPAZINE) 10 MG tablet Take 1 tablet (10 mg total) by mouth every 6 (six) hours as needed for nausea or vomiting. 30 tablet 1   senna-docusate (SENOKOT-S) 8.6-50 MG tablet Take 1 tablet by mouth 2 (two) times daily. While taking strong pain meds to prevent constipation. (Patient not taking: Reported on 11/07/2024) 10 tablet 0   sodium bicarbonate 650 MG tablet take 1300 mg of sodium bicarbonate (two 650 mg tabs) the night before and morning of intravesical chemotherapy 32 tablet 0   No current facility-administered medications for this visit.    REVIEW OF SYSTEMS:   All relevant systems were reviewed with the patient and are negative.  PHYSICAL EXAMINATION: ECOG PERFORMANCE STATUS: 1  Vitals:   11/07/24 1226 11/07/24 1227  BP: (!) 153/94 129/80  Pulse: (!) 105   Resp: 17   Temp: 97.7 F (36.5 C)   SpO2: 95%    Filed Weights   11/07/24 1226  Weight: 178 lb (80.7 kg)    GENERAL: alert, no distress and comfortable SKIN: skin color is normal, no jaundice, rashes or significant lesions EYES: sclera clear OROPHARYNX: no exudate, no erythema NECK: supple LYMPH:  no palpable lymphadenopathy in the cervical, axillary regions LUNGS: Effort normal, no respiratory distress.  Clear to auscultation bilaterally HEART: regular rate & rhythm and no lower extremity edema ABDOMEN: soft, non-tender and nondistended Musculoskeletal: no edema  LABORATORY DATA:  I have reviewed the data as listed Lab Results  Component Value Date   WBC 4.5 11/07/2024   HGB 10.2 (L) 11/07/2024   HCT 31.9 (L) 11/07/2024   MCV 91.4 11/07/2024   PLT 250 11/07/2024   Recent Labs    01/13/24 0716 09/26/24 1120 11/07/24 1335  NA 142 140 142  K 4.3 4.7 4.0  CL 107 103 108  CO2  --  27 27  GLUCOSE 95 83 97  BUN 15 17 18   CREATININE 1.40* 1.31* 1.10*   CALCIUM  --  10.0 9.4  GFRNONAA  --  45* 56*  PROT  --   --  6.8  ALBUMIN  --   --  3.7  AST  --   --  12*  ALT  --   --  10  ALKPHOS  --   --  117  BILITOT  --   --  0.4    RADIOGRAPHIC STUDIES: I have personally reviewed the radiological images as listed and agreed with the findings in the report.

## 2024-11-07 ENCOUNTER — Inpatient Hospital Stay

## 2024-11-07 VITALS — BP 129/80 | HR 105 | Temp 97.7°F | Resp 17 | Ht 67.0 in | Wt 178.0 lb

## 2024-11-07 DIAGNOSIS — Z8541 Personal history of malignant neoplasm of cervix uteri: Secondary | ICD-10-CM | POA: Diagnosis not present

## 2024-11-07 DIAGNOSIS — N390 Urinary tract infection, site not specified: Secondary | ICD-10-CM | POA: Diagnosis not present

## 2024-11-07 DIAGNOSIS — Z823 Family history of stroke: Secondary | ICD-10-CM | POA: Diagnosis not present

## 2024-11-07 DIAGNOSIS — Z880 Allergy status to penicillin: Secondary | ICD-10-CM | POA: Diagnosis not present

## 2024-11-07 DIAGNOSIS — Z9049 Acquired absence of other specified parts of digestive tract: Secondary | ICD-10-CM | POA: Diagnosis not present

## 2024-11-07 DIAGNOSIS — Z79899 Other long term (current) drug therapy: Secondary | ICD-10-CM | POA: Diagnosis not present

## 2024-11-07 DIAGNOSIS — J45909 Unspecified asthma, uncomplicated: Secondary | ICD-10-CM | POA: Diagnosis not present

## 2024-11-07 DIAGNOSIS — Z8249 Family history of ischemic heart disease and other diseases of the circulatory system: Secondary | ICD-10-CM | POA: Diagnosis not present

## 2024-11-07 DIAGNOSIS — C679 Malignant neoplasm of bladder, unspecified: Secondary | ICD-10-CM | POA: Diagnosis not present

## 2024-11-07 DIAGNOSIS — Z800 Family history of malignant neoplasm of digestive organs: Secondary | ICD-10-CM | POA: Diagnosis not present

## 2024-11-07 DIAGNOSIS — I10 Essential (primary) hypertension: Secondary | ICD-10-CM | POA: Diagnosis not present

## 2024-11-07 DIAGNOSIS — Z5112 Encounter for antineoplastic immunotherapy: Secondary | ICD-10-CM | POA: Diagnosis not present

## 2024-11-07 DIAGNOSIS — Z806 Family history of leukemia: Secondary | ICD-10-CM | POA: Diagnosis not present

## 2024-11-07 DIAGNOSIS — Z90710 Acquired absence of both cervix and uterus: Secondary | ICD-10-CM | POA: Diagnosis not present

## 2024-11-07 DIAGNOSIS — C779 Secondary and unspecified malignant neoplasm of lymph node, unspecified: Secondary | ICD-10-CM | POA: Diagnosis not present

## 2024-11-07 DIAGNOSIS — Z8701 Personal history of pneumonia (recurrent): Secondary | ICD-10-CM | POA: Diagnosis not present

## 2024-11-07 DIAGNOSIS — Z87891 Personal history of nicotine dependence: Secondary | ICD-10-CM | POA: Diagnosis not present

## 2024-11-07 LAB — CBC WITH DIFFERENTIAL (CANCER CENTER ONLY)
Abs Immature Granulocytes: 0.03 K/uL (ref 0.00–0.07)
Basophils Absolute: 0 K/uL (ref 0.0–0.1)
Basophils Relative: 1 %
Eosinophils Absolute: 0.1 K/uL (ref 0.0–0.5)
Eosinophils Relative: 3 %
HCT: 31.9 % — ABNORMAL LOW (ref 36.0–46.0)
Hemoglobin: 10.2 g/dL — ABNORMAL LOW (ref 12.0–15.0)
Immature Granulocytes: 1 %
Lymphocytes Relative: 26 %
Lymphs Abs: 1.2 K/uL (ref 0.7–4.0)
MCH: 29.2 pg (ref 26.0–34.0)
MCHC: 32 g/dL (ref 30.0–36.0)
MCV: 91.4 fL (ref 80.0–100.0)
Monocytes Absolute: 0.4 K/uL (ref 0.1–1.0)
Monocytes Relative: 10 %
Neutro Abs: 2.7 K/uL (ref 1.7–7.7)
Neutrophils Relative %: 59 %
Platelet Count: 250 K/uL (ref 150–400)
RBC: 3.49 MIL/uL — ABNORMAL LOW (ref 3.87–5.11)
RDW: 13.5 % (ref 11.5–15.5)
WBC Count: 4.5 K/uL (ref 4.0–10.5)
nRBC: 0 % (ref 0.0–0.2)

## 2024-11-07 LAB — CMP (CANCER CENTER ONLY)
ALT: 10 U/L (ref 0–44)
AST: 12 U/L — ABNORMAL LOW (ref 15–41)
Albumin: 3.7 g/dL (ref 3.5–5.0)
Alkaline Phosphatase: 117 U/L (ref 38–126)
Anion gap: 7 (ref 5–15)
BUN: 18 mg/dL (ref 8–23)
CO2: 27 mmol/L (ref 22–32)
Calcium: 9.4 mg/dL (ref 8.9–10.3)
Chloride: 108 mmol/L (ref 98–111)
Creatinine: 1.1 mg/dL — ABNORMAL HIGH (ref 0.44–1.00)
GFR, Estimated: 56 mL/min — ABNORMAL LOW (ref >60)
Glucose, Bld: 97 mg/dL (ref 70–99)
Potassium: 4 mmol/L (ref 3.5–5.1)
Sodium: 142 mmol/L (ref 135–145)
Total Bilirubin: 0.4 mg/dL (ref 0.0–1.2)
Total Protein: 6.8 g/dL (ref 6.5–8.1)

## 2024-11-08 ENCOUNTER — Other Ambulatory Visit: Payer: Self-pay | Admitting: *Deleted

## 2024-11-08 ENCOUNTER — Encounter: Payer: Self-pay | Admitting: *Deleted

## 2024-11-08 MED ORDER — ONDANSETRON HCL 8 MG PO TABS: 8.0000 mg | ORAL_TABLET | Freq: Three times a day (TID) | ORAL | 1 refills | Status: DC | PRN

## 2024-11-08 MED ORDER — PHENAZOPYRIDINE HCL 200 MG PO TABS: 200.0000 mg | ORAL_TABLET | Freq: Three times a day (TID) | ORAL | 0 refills | Status: DC | PRN

## 2024-11-08 MED ORDER — SODIUM BICARBONATE 650 MG PO TABS: ORAL_TABLET | ORAL | 0 refills | Status: DC

## 2024-11-08 MED ORDER — LIDOCAINE-PRILOCAINE 2.5-2.5 % EX CREA: TOPICAL_CREAM | CUTANEOUS | 3 refills | Status: DC

## 2024-11-08 MED ORDER — PROCHLORPERAZINE MALEATE 10 MG PO TABS: 10.0000 mg | ORAL_TABLET | Freq: Four times a day (QID) | ORAL | 1 refills | Status: DC | PRN

## 2024-11-08 NOTE — Assessment & Plan Note (Signed)
 Plan to proceed with intravesical therapy Avoid all diuretics (including alcohol & coffee) - within 8 hours of treatment Teaching to be completed Treatment plan entered Prescribed Pyridium  200 mg the morning of treatment, then 3 times daily for 2 days as needed for pain Prescribed 1300 mg of sodium bicarbonate (two 650 mg tabs) the night before and morning of therapy to alkalinize the urine. Premed with oxybutynin and compazine Plan to start treatment in 3-4 weeks Return weekly with lab and MD visit during induction Follow up with cystoscopy every 3 months

## 2024-11-09 ENCOUNTER — Ambulatory Visit: Payer: Self-pay

## 2024-11-09 DIAGNOSIS — C679 Malignant neoplasm of bladder, unspecified: Secondary | ICD-10-CM

## 2024-11-09 NOTE — Telephone Encounter (Signed)
 [name] states she is fine to come in and have additional labs drawn to work up anemia.  Message to scheduler, labs ordered

## 2024-11-10 ENCOUNTER — Other Ambulatory Visit: Payer: Self-pay

## 2024-11-10 NOTE — Progress Notes (Signed)
 Please put in a note and let lab know only draw B12, folate, iron, ferritin and Methylmalonic acid when she returns for next blood draw.   Some other orders are for future from [facility] plan Thanks.

## 2024-11-22 ENCOUNTER — Telehealth: Payer: Self-pay

## 2024-11-22 NOTE — Telephone Encounter (Signed)
 Scheduled patient a lab appt this week. Called and spoke with the patient, she is aware.

## 2024-11-23 ENCOUNTER — Other Ambulatory Visit: Payer: Self-pay

## 2024-11-24 ENCOUNTER — Other Ambulatory Visit: Payer: Self-pay

## 2024-11-24 NOTE — Progress Notes (Signed)
 Patient elected cystectomy. Treatment plan canceled.  Please schedule follow up with me on 12/11 at 4:30 to go over lab results for anemia. Thanks.

## 2024-11-25 ENCOUNTER — Inpatient Hospital Stay

## 2024-11-25 DIAGNOSIS — C674 Malignant neoplasm of posterior wall of bladder: Secondary | ICD-10-CM | POA: Diagnosis not present

## 2024-11-25 DIAGNOSIS — C679 Malignant neoplasm of bladder, unspecified: Secondary | ICD-10-CM | POA: Insufficient documentation

## 2024-11-25 LAB — IRON AND IRON BINDING CAPACITY (CC-WL,HP ONLY)
Iron: 35 ug/dL (ref 28–170)
Saturation Ratios: 14 % (ref 10.4–31.8)
TIBC: 249 ug/dL — ABNORMAL LOW (ref 250–450)
UIBC: 215 ug/dL

## 2024-11-25 LAB — VITAMIN B12: Vitamin B-12: 341 pg/mL (ref 180–914)

## 2024-11-25 LAB — FERRITIN: Ferritin: 386 ng/mL — ABNORMAL HIGH (ref 11–307)

## 2024-11-28 LAB — FOLATE RBC
Folate, Hemolysate: 435 ng/mL
Folate, RBC: 1338 ng/mL (ref >498)
Hematocrit: 32.5 % — ABNORMAL LOW (ref 34.0–46.6)

## 2024-11-29 LAB — METHYLMALONIC ACID, SERUM: Methylmalonic Acid, Quantitative: 222 nmol/L (ref 0–378)

## 2024-11-30 ENCOUNTER — Other Ambulatory Visit: Payer: Self-pay

## 2024-11-30 ENCOUNTER — Other Ambulatory Visit: Payer: Self-pay | Admitting: Urology

## 2024-11-30 DIAGNOSIS — D649 Anemia, unspecified: Secondary | ICD-10-CM

## 2024-12-05 ENCOUNTER — Telehealth: Payer: Self-pay

## 2024-12-05 NOTE — Progress Notes (Unsigned)
 [facility] OFFICE PROGRESS NOTE  Patient Care Team: [name] [name] as PCP - General (Family Medicine) Daisy Rosella, NP (Inactive) as PCP - Family Medicine (Nurse Practitioner) Daisy Carlin POUR, DO as Consulting Physician (Internal Medicine)  [name] is being followed up through phone visit at her due to anemia.  Telephone visit requested per patient. Physician location: [facility] Patient location: Home Total time for the encounter: 10 minutes   Lab work was done with anemia, and history of bladder cancer.  Results showed iron deficiency, elevated ferritin.  Borderline B12.  Normal folate.  MCV was normal and hemoglobin was 10.2.  Discussed results today.  She has started oral B12 supplement.  She will continue.  There is no signs of bleeding.  She has scheduled surgery in mid January.  Will see her in mid February with labs. Assessment & Plan Normocytic anemia Low B12, anemia of inflammation with elevated ferritin and low iron saturation Patient will continue B12 supplement Repeat labs in mid February.  Lab on 2/16 & office visit on 3/2.   Daisy JAYSON Chihuahua, MD  INTERVAL HISTORY: [name] is being follow-up through telephone visit for recent lab results.  She says she is feeling better.  No bleeding or bloody stool or bloody urine.  She started B12 supplement.  Oncology History  Bladder cancer (HCC)  09/05/2022 Initial Diagnosis   Bladder cancer (HCC)   12/05/2024 - 12/05/2024 Chemotherapy   Patient is on Treatment Plan : BLADDER Gemcitabine (1000), Docetaxel (37.5) INTRAVESICAL q7d      Relevant data reviewed during this visit included labs.

## 2024-12-05 NOTE — Telephone Encounter (Signed)
 Scheduled patient for a follow-up to go over lab results from 12/5. Called and spoke with the patient, she is aware of the day and time.

## 2024-12-06 ENCOUNTER — Inpatient Hospital Stay

## 2024-12-06 DIAGNOSIS — D649 Anemia, unspecified: Secondary | ICD-10-CM | POA: Diagnosis not present

## 2024-12-06 NOTE — Assessment & Plan Note (Addendum)
 Low B12, anemia of inflammation with elevated ferritin and low iron saturation Patient will continue B12 supplement Repeat labs in mid February.  Lab on 2/16 & office visit on 3/2.

## 2024-12-12 DIAGNOSIS — C678 Malignant neoplasm of overlapping sites of bladder: Secondary | ICD-10-CM | POA: Diagnosis not present

## 2025-01-02 ENCOUNTER — Ambulatory Visit (HOSPITAL_COMMUNITY): Admitting: Nurse Practitioner

## 2025-01-02 ENCOUNTER — Ambulatory Visit (HOSPITAL_COMMUNITY)
Admission: RE | Admit: 2025-01-02 | Discharge: 2025-01-02 | Disposition: A | Discharge status: Home / Self Care | Source: Ambulatory Visit | Attending: Urology | Admitting: Urology

## 2025-01-02 DIAGNOSIS — Z008 Encounter for other general examination: Secondary | ICD-10-CM | POA: Insufficient documentation

## 2025-01-02 DIAGNOSIS — Z719 Counseling, unspecified: Secondary | ICD-10-CM | POA: Diagnosis not present

## 2025-01-02 DIAGNOSIS — C679 Malignant neoplasm of bladder, unspecified: Secondary | ICD-10-CM | POA: Diagnosis not present

## 2025-01-02 NOTE — Progress Notes (Signed)
 [name]   Reason for visit:  [name], [name] HPI:  Bladder cancer Past Medical History:  Diagnosis Date   Asthma    rarely uses   Cancer (HCC) 07/2022   Bladder cancer, treated with 6 treatments of BCG, follows w/ [facility].   Cervical cancer (HCC) 1992   treated w/ hysterectomy and radiation   Depression    GERD (gastroesophageal reflux disease)    no longer suffering from   Hyperlipidemia    Hypertension    Follows w/ Lucie Mace, PA.   IFG (impaired fasting glucose)    Neuromuscular disorder (HCC) 2023   Patient has numbness and tingling in arms and hands from back surgery.   Obesity    Palpitations    Peripheral vertigo    Pneumonia    about 20 years ago per pt on 01/08/24   Thyroid disease    hx of many years ago   Wears dentures    upper and lower   Wears glasses    Family History  Problem Relation Age of Onset   Cancer Mother        colon   Stroke Mother    Heart disease Father    Heart disease Maternal Grandmother    Cancer Maternal Grandfather        colon   Cancer Son        Leukemia   Allergies[1] Current Outpatient Medications  Medication Sig Dispense Refill Last Dose/Taking   acetaminophen  (TYLENOL ) 325 MG tablet Take 650 mg by mouth 2 (two) times daily as needed for mild pain.      albuterol  (VENTOLIN  HFA) 108 (90 Base) MCG/ACT inhaler Inhale 1-2 puffs into the lungs every 6 (six) hours as needed for wheezing or shortness of breath.      Cyanocobalamin  (VITAMIN B12 PO) Take 1 tablet by mouth daily. (Patient not taking: Reported on 12/30/2024)      hydrochlorothiazide  (HYDRODIURIL ) 25 MG tablet Take 25 mg by mouth daily. (Patient not taking: No sig reported)      senna-docusate (SENOKOT-S) 8.6-50 MG tablet Take 1 tablet by mouth 2 (two) times daily. While taking strong pain meds to prevent constipation. (Patient not taking: Reported on 11/07/2024) 10 tablet 0    No  current facility-administered medications for this encounter.   ROS  Review of Systems  Respiratory:         Asthma  Cardiovascular:        Hypertension  Skin: Negative.   Neurological:  Positive for numbness.  All other systems reviewed and are negative.  Vital signs:  BP 135/82 (BP Location: Right Arm)   Pulse 88   Temp 97.8 F (36.6 C) (Oral)   Resp 19   LMP 02/05/1991   SpO2 99%  Exam:  Physical Exam Vitals reviewed.  Constitutional:      Appearance: Normal appearance.  HENT:     Mouth/Throat:     Mouth: Mucous membranes are moist.  Cardiovascular:     Rate and Rhythm: Normal rate.  Abdominal:     Palpations: Abdomen is soft.  Musculoskeletal:        General: Normal range of motion.  Skin:    General: Skin is warm and dry.  Neurological:     Mental Status: She is alert and oriented to person, place, and time.     Sensory: Sensory deficit present.  Psychiatric:        Mood and Affect: Mood normal.  Behavior: Behavior normal.     Comments: Anxiety regarding surgery     Discussed surgical procedure and stoma creation with patient and spouse  Explained role of the inpatient  WOC nurse team.  Provided the patient with educational booklet and provided samples of pouching options.  Answered patient and family questions.   Patient enjoys being at the beach, being active.  Is no longer employed. WE discuss an active life with an ostomy.   Examined patient lying, sitting, and standing in order to place the Daisy in the patient's visual field, away from any creases or abdominal contour issues and within the rectus muscle.    Marked for ileal Daniel in the RLQ 4 cm to the right of the umbilicus and  3 cm  below the umbilicus.  Patient's abdomen cleansed with CHG wipes at site markings, allowed to air dry prior to Daisy.Covered mark with thin film transparent dressing to preserve mark until date of surgery.      Education provided:  Questions answered regarding  ileal Daniel.  Patient states discussions with urology team indicate [name].  The inpatient team will see patient after surgery and patient can follow up in ostomy Daniel if needed.     Impression/dx  Cancer Pre operative teaching Discussion  See above Plan  See after surgery to assess teaching needs.     Visit time: 40 minutes.   Darice Cooley FNP-BC        [1]  Allergies Allergen Reactions   Augmentin [Amoxicillin-Pot Clavulanate] Diarrhea and Nausea And Vomiting    Pt decreased dose to QD HS and tolerated well

## 2025-01-03 ENCOUNTER — Inpatient Hospital Stay (HOSPITAL_COMMUNITY)
Admission: RE | Admit: 2025-01-03 | Discharge: 2025-01-09 | DRG: 654 | Disposition: A | Discharge status: Home / Self Care | Source: Ambulatory Visit | Attending: Urology | Admitting: Urology

## 2025-01-03 DIAGNOSIS — Z87891 Personal history of nicotine dependence: Secondary | ICD-10-CM | POA: Diagnosis not present

## 2025-01-03 DIAGNOSIS — N895 Stricture and atresia of vagina: Secondary | ICD-10-CM | POA: Diagnosis present

## 2025-01-03 DIAGNOSIS — Z806 Family history of leukemia: Secondary | ICD-10-CM

## 2025-01-03 DIAGNOSIS — C679 Malignant neoplasm of bladder, unspecified: Secondary | ICD-10-CM | POA: Diagnosis not present

## 2025-01-03 DIAGNOSIS — E785 Hyperlipidemia, unspecified: Secondary | ICD-10-CM | POA: Diagnosis present

## 2025-01-03 DIAGNOSIS — Z8249 Family history of ischemic heart disease and other diseases of the circulatory system: Secondary | ICD-10-CM

## 2025-01-03 DIAGNOSIS — J4489 Other specified chronic obstructive pulmonary disease: Secondary | ICD-10-CM | POA: Diagnosis present

## 2025-01-03 DIAGNOSIS — Z7189 Other specified counseling: Secondary | ICD-10-CM | POA: Insufficient documentation

## 2025-01-03 DIAGNOSIS — K660 Peritoneal adhesions (postprocedural) (postinfection): Secondary | ICD-10-CM | POA: Diagnosis present

## 2025-01-03 DIAGNOSIS — C673 Malignant neoplasm of anterior wall of bladder: Principal | ICD-10-CM | POA: Diagnosis present

## 2025-01-03 DIAGNOSIS — J45909 Unspecified asthma, uncomplicated: Secondary | ICD-10-CM | POA: Diagnosis not present

## 2025-01-03 DIAGNOSIS — C662 Malignant neoplasm of left ureter: Secondary | ICD-10-CM | POA: Diagnosis present

## 2025-01-03 DIAGNOSIS — I10 Essential (primary) hypertension: Secondary | ICD-10-CM | POA: Diagnosis present

## 2025-01-03 DIAGNOSIS — Z823 Family history of stroke: Secondary | ICD-10-CM

## 2025-01-03 LAB — CBC
HCT: 34.3 % — ABNORMAL LOW (ref 36.0–46.0)
Hemoglobin: 10.8 g/dL — ABNORMAL LOW (ref 12.0–15.0)
MCH: 28.1 pg (ref 26.0–34.0)
MCHC: 31.5 g/dL (ref 30.0–36.0)
MCV: 89.3 fL (ref 80.0–100.0)
Platelets: 201 K/uL (ref 150–400)
RBC: 3.84 MIL/uL — ABNORMAL LOW (ref 3.87–5.11)
RDW: 14.2 % (ref 11.5–15.5)
WBC: 5.3 K/uL (ref 4.0–10.5)
nRBC: 0 % (ref 0.0–0.2)

## 2025-01-03 LAB — TYPE AND SCREEN
ABO/RH(D): O POS
Antibody Screen: NEGATIVE

## 2025-01-03 LAB — BASIC METABOLIC PANEL WITH GFR
Anion gap: 11 (ref 5–15)
BUN: 15 mg/dL (ref 8–23)
CO2: 24 mmol/L (ref 22–32)
Calcium: 11 mg/dL — ABNORMAL HIGH (ref 8.9–10.3)
Chloride: 107 mmol/L (ref 98–111)
Creatinine, Ser: 1.16 mg/dL — ABNORMAL HIGH (ref 0.44–1.00)
GFR, Estimated: 52 mL/min — ABNORMAL LOW
Glucose, Bld: 91 mg/dL (ref 70–99)
Potassium: 3.7 mmol/L (ref 3.5–5.1)
Sodium: 141 mmol/L (ref 135–145)

## 2025-01-03 MED ORDER — METRONIDAZOLE 500 MG PO TABS
500.0000 mg | ORAL_TABLET | ORAL | Status: AC
  Administered 2025-01-03 – 2025-01-04 (×2): 500 mg via ORAL
  Filled 2025-01-03 (×2): qty 1

## 2025-01-03 MED ORDER — SODIUM CHLORIDE 0.9 % IV SOLN: INTRAVENOUS | Status: DC

## 2025-01-03 MED ORDER — ALVIMOPAN 12 MG PO CAPS
12.0000 mg | ORAL_CAPSULE | ORAL | Status: AC
  Administered 2025-01-04: 12 mg via ORAL
  Filled 2025-01-03: qty 1

## 2025-01-03 MED ORDER — NEOMYCIN SULFATE 500 MG PO TABS
500.0000 mg | ORAL_TABLET | ORAL | Status: AC
  Administered 2025-01-03 – 2025-01-04 (×2): 500 mg via ORAL
  Filled 2025-01-03 (×2): qty 1

## 2025-01-03 MED ORDER — PEG 3350-KCL-NA BICARB-NACL 420 G PO SOLR
4000.0000 mL | Freq: Once | ORAL | Status: AC
  Administered 2025-01-03: 4000 mL via ORAL

## 2025-01-03 MED ORDER — ALBUTEROL SULFATE (2.5 MG/3ML) 0.083% IN NEBU: 3.0000 mL | INHALATION_SOLUTION | Freq: Four times a day (QID) | RESPIRATORY_TRACT | Status: DC | PRN

## 2025-01-03 MED ORDER — PNEUMOCOCCAL 20-VAL CONJ VACC 0.5 ML IM SUSY
0.5000 mL | PREFILLED_SYRINGE | INTRAMUSCULAR | Status: DC
  Filled 2025-01-03: qty 0.5

## 2025-01-04 ENCOUNTER — Other Ambulatory Visit: Payer: Self-pay

## 2025-01-04 ENCOUNTER — Encounter (HOSPITAL_COMMUNITY): Payer: Self-pay

## 2025-01-04 ENCOUNTER — Inpatient Hospital Stay (HOSPITAL_COMMUNITY): Payer: Self-pay

## 2025-01-04 ENCOUNTER — Encounter (HOSPITAL_COMMUNITY): Payer: Self-pay | Admitting: Urology

## 2025-01-04 ENCOUNTER — Encounter (HOSPITAL_COMMUNITY)
Admission: RE | Disposition: A | Discharge status: Home / Self Care | Payer: Self-pay | Source: Ambulatory Visit | Attending: Urology

## 2025-01-04 DIAGNOSIS — C679 Malignant neoplasm of bladder, unspecified: Secondary | ICD-10-CM | POA: Diagnosis not present

## 2025-01-04 DIAGNOSIS — J45909 Unspecified asthma, uncomplicated: Secondary | ICD-10-CM | POA: Diagnosis not present

## 2025-01-04 DIAGNOSIS — Z87891 Personal history of nicotine dependence: Secondary | ICD-10-CM | POA: Diagnosis not present

## 2025-01-04 DIAGNOSIS — I10 Essential (primary) hypertension: Secondary | ICD-10-CM

## 2025-01-04 HISTORY — PX: ROBOT ASSITED LAPAROSCOPIC NEPHROURETERECTOMY: SHX6077

## 2025-01-04 HISTORY — PX: ROBOT ASSISTED LAPAROSCOPIC COMPLETE CYSTECT ILEAL CONDUIT: SHX5139

## 2025-01-04 LAB — HEMOGLOBIN AND HEMATOCRIT, BLOOD
HCT: 35.1 % — ABNORMAL LOW (ref 36.0–46.0)
Hemoglobin: 11 g/dL — ABNORMAL LOW (ref 12.0–15.0)

## 2025-01-04 LAB — MRSA NEXT GEN BY PCR, NASAL: MRSA by PCR Next Gen: NOT DETECTED

## 2025-01-04 MED ORDER — MIDAZOLAM HCL 5 MG/5ML IJ SOLN
INTRAMUSCULAR | Status: DC | PRN
  Administered 2025-01-04: 2 mg via INTRAVENOUS

## 2025-01-04 MED ORDER — DOCUSATE SODIUM 100 MG PO CAPS
100.0000 mg | ORAL_CAPSULE | Freq: Two times a day (BID) | ORAL | Status: DC
  Administered 2025-01-04 – 2025-01-06 (×4): 100 mg via ORAL
  Filled 2025-01-04 (×5): qty 1

## 2025-01-04 MED ORDER — SUGAMMADEX SODIUM 200 MG/2ML IV SOLN
INTRAVENOUS | Status: DC | PRN
  Administered 2025-01-04: 200 mg via INTRAVENOUS

## 2025-01-04 MED ORDER — HYDROMORPHONE HCL 1 MG/ML IJ SOLN
0.5000 mg | INTRAMUSCULAR | Status: DC | PRN
  Administered 2025-01-04 – 2025-01-07 (×10): 1 mg via INTRAVENOUS
  Administered 2025-01-09: 0.5 mg via INTRAVENOUS
  Filled 2025-01-04 (×11): qty 1

## 2025-01-04 MED ORDER — DEXAMETHASONE SOD PHOSPHATE PF 10 MG/ML IJ SOLN
INTRAMUSCULAR | Status: DC | PRN
  Administered 2025-01-04: 5 mg via INTRAVENOUS

## 2025-01-04 MED ORDER — DEXMEDETOMIDINE HCL IN NACL 80 MCG/20ML IV SOLN
INTRAVENOUS | Status: DC | PRN
  Administered 2025-01-04: 4 ug via INTRAVENOUS
  Administered 2025-01-04 (×2): 8 ug via INTRAVENOUS
  Administered 2025-01-04: 4 ug via INTRAVENOUS

## 2025-01-04 MED ORDER — ACETAMINOPHEN 10 MG/ML IV SOLN: 1000.0000 mg | Freq: Once | INTRAVENOUS | Status: DC | PRN

## 2025-01-04 MED ORDER — ONDANSETRON HCL 4 MG/2ML IJ SOLN
INTRAMUSCULAR | Status: DC | PRN
  Administered 2025-01-04: 4 mg via INTRAVENOUS

## 2025-01-04 MED ORDER — SODIUM CHLORIDE (PF) 0.9 % IJ SOLN
INTRAMUSCULAR | Status: AC
  Filled 2025-01-04: qty 20

## 2025-01-04 MED ORDER — ONDANSETRON HCL 4 MG/2ML IJ SOLN: 4.0000 mg | INTRAMUSCULAR | Status: DC | PRN

## 2025-01-04 MED ORDER — LIDOCAINE HCL (PF) 2 % IJ SOLN
INTRAMUSCULAR | Status: AC
  Filled 2025-01-04: qty 5

## 2025-01-04 MED ORDER — ROCURONIUM BROMIDE 100 MG/10ML IV SOLN
INTRAVENOUS | Status: DC | PRN
  Administered 2025-01-04: 20 mg via INTRAVENOUS
  Administered 2025-01-04: 60 mg via INTRAVENOUS
  Administered 2025-01-04: 30 mg via INTRAVENOUS
  Administered 2025-01-04: 10 mg via INTRAVENOUS

## 2025-01-04 MED ORDER — PROPOFOL 10 MG/ML IV BOLUS
INTRAVENOUS | Status: DC | PRN
  Administered 2025-01-04: 150 mg via INTRAVENOUS

## 2025-01-04 MED ORDER — FENTANYL CITRATE (PF) 50 MCG/ML IJ SOSY
PREFILLED_SYRINGE | INTRAMUSCULAR | Status: AC
  Filled 2025-01-04: qty 3

## 2025-01-04 MED ORDER — ROCURONIUM BROMIDE 10 MG/ML (PF) SYRINGE
PREFILLED_SYRINGE | INTRAVENOUS | Status: AC
  Filled 2025-01-04: qty 10

## 2025-01-04 MED ORDER — AMISULPRIDE (ANTIEMETIC) 5 MG/2ML IV SOLN: 10.0000 mg | Freq: Once | INTRAVENOUS | Status: DC | PRN

## 2025-01-04 MED ORDER — DEXMEDETOMIDINE HCL IN NACL 80 MCG/20ML IV SOLN
INTRAVENOUS | Status: AC
  Filled 2025-01-04: qty 20

## 2025-01-04 MED ORDER — KETAMINE HCL 50 MG/5ML IJ SOSY
PREFILLED_SYRINGE | INTRAMUSCULAR | Status: DC | PRN
  Administered 2025-01-04 (×2): 10 mg via INTRAVENOUS
  Administered 2025-01-04: 30 mg via INTRAVENOUS

## 2025-01-04 MED ORDER — ACETAMINOPHEN 10 MG/ML IV SOLN
1000.0000 mg | Freq: Four times a day (QID) | INTRAVENOUS | Status: AC
  Administered 2025-01-05 (×3): 1000 mg via INTRAVENOUS
  Filled 2025-01-04 (×4): qty 100

## 2025-01-04 MED ORDER — PIPERACILLIN-TAZOBACTAM 3.375 G IVPB 30 MIN
3.3750 g | Freq: Once | INTRAVENOUS | Status: AC
  Administered 2025-01-04 (×2): 3.375 g via INTRAVENOUS
  Filled 2025-01-04: qty 50

## 2025-01-04 MED ORDER — ONDANSETRON HCL 4 MG/2ML IJ SOLN
INTRAMUSCULAR | Status: AC
  Filled 2025-01-04: qty 2

## 2025-01-04 MED ORDER — KETAMINE HCL 50 MG/5ML IJ SOSY
PREFILLED_SYRINGE | INTRAMUSCULAR | Status: AC
  Filled 2025-01-04: qty 5

## 2025-01-04 MED ORDER — LIDOCAINE HCL (CARDIAC) PF 100 MG/5ML IV SOSY
PREFILLED_SYRINGE | INTRAVENOUS | Status: DC | PRN
  Administered 2025-01-04: 60 mg via INTRAVENOUS

## 2025-01-04 MED ORDER — PROPOFOL 10 MG/ML IV BOLUS
INTRAVENOUS | Status: AC
  Filled 2025-01-04: qty 20

## 2025-01-04 MED ORDER — DIPHENHYDRAMINE HCL 50 MG/ML IJ SOLN: 12.5000 mg | Freq: Four times a day (QID) | INTRAMUSCULAR | Status: DC | PRN

## 2025-01-04 MED ORDER — BUPIVACAINE LIPOSOME 1.3 % IJ SUSP
INTRAMUSCULAR | Status: AC
  Filled 2025-01-04: qty 20

## 2025-01-04 MED ORDER — CHLORHEXIDINE GLUCONATE 0.12 % MT SOLN
15.0000 mL | Freq: Once | OROMUCOSAL | Status: AC
  Administered 2025-01-04: 15 mL via OROMUCOSAL

## 2025-01-04 MED ORDER — FENTANYL CITRATE (PF) 100 MCG/2ML IJ SOLN
INTRAMUSCULAR | Status: DC | PRN
  Administered 2025-01-04 (×2): 50 ug via INTRAVENOUS
  Administered 2025-01-04: 25 ug via INTRAVENOUS
  Administered 2025-01-04: 50 ug via INTRAVENOUS
  Administered 2025-01-04 (×3): 25 ug via INTRAVENOUS

## 2025-01-04 MED ORDER — ACETAMINOPHEN 10 MG/ML IV SOLN
INTRAVENOUS | Status: DC | PRN
  Administered 2025-01-04: 1000 mg via INTRAVENOUS

## 2025-01-04 MED ORDER — ACETAMINOPHEN 10 MG/ML IV SOLN
INTRAVENOUS | Status: AC
  Filled 2025-01-04: qty 100

## 2025-01-04 MED ORDER — PIPERACILLIN-TAZOBACTAM 3.375 G IVPB 30 MIN
3.3750 g | Freq: Once | INTRAVENOUS | Status: DC
  Filled 2025-01-04: qty 50

## 2025-01-04 MED ORDER — FENTANYL CITRATE (PF) 50 MCG/ML IJ SOSY
25.0000 ug | PREFILLED_SYRINGE | INTRAMUSCULAR | Status: DC | PRN
  Administered 2025-01-04 (×2): 50 ug via INTRAVENOUS

## 2025-01-04 MED ORDER — DEXAMETHASONE SOD PHOSPHATE PF 10 MG/ML IJ SOLN
INTRAMUSCULAR | Status: AC
  Filled 2025-01-04: qty 1

## 2025-01-04 MED ORDER — MIDAZOLAM HCL 2 MG/2ML IJ SOLN
INTRAMUSCULAR | Status: AC
  Filled 2025-01-04: qty 2

## 2025-01-04 MED ORDER — OXYCODONE HCL 5 MG PO TABS
5.0000 mg | ORAL_TABLET | ORAL | Status: DC | PRN
  Administered 2025-01-04 – 2025-01-08 (×8): 5 mg via ORAL
  Filled 2025-01-04 (×9): qty 1

## 2025-01-04 MED ORDER — PHENYLEPHRINE HCL-NACL 20-0.9 MG/250ML-% IV SOLN
INTRAVENOUS | Status: DC | PRN
  Administered 2025-01-04: 10 ug/min via INTRAVENOUS

## 2025-01-04 MED ORDER — ONDANSETRON HCL 4 MG/2ML IJ SOLN: 4.0000 mg | Freq: Once | INTRAMUSCULAR | Status: DC | PRN

## 2025-01-04 MED ORDER — BUPIVACAINE LIPOSOME 1.3 % IJ SUSP
INTRAMUSCULAR | Status: DC | PRN
  Administered 2025-01-04: 20 mL

## 2025-01-04 MED ORDER — LACTATED RINGERS IV SOLN: INTRAVENOUS | Status: DC

## 2025-01-04 MED ORDER — FENTANYL CITRATE (PF) 250 MCG/5ML IJ SOLN
INTRAMUSCULAR | Status: AC
  Filled 2025-01-04: qty 5

## 2025-01-04 MED ORDER — ALBUTEROL SULFATE HFA 108 (90 BASE) MCG/ACT IN AERS
INHALATION_SPRAY | RESPIRATORY_TRACT | Status: DC | PRN
  Administered 2025-01-04: 2 via RESPIRATORY_TRACT

## 2025-01-04 MED ORDER — SUGAMMADEX SODIUM 200 MG/2ML IV SOLN
INTRAVENOUS | Status: AC
  Filled 2025-01-04: qty 2

## 2025-01-04 MED ORDER — HEMOSTATIC AGENTS (NO CHARGE) OPTIME
TOPICAL | Status: DC | PRN
  Administered 2025-01-04: 1 via TOPICAL

## 2025-01-04 MED ORDER — SODIUM CHLORIDE 0.9 % IV SOLN: INTRAVENOUS | Status: AC

## 2025-01-04 MED ORDER — ALBUTEROL SULFATE HFA 108 (90 BASE) MCG/ACT IN AERS
INHALATION_SPRAY | RESPIRATORY_TRACT | Status: AC
  Filled 2025-01-04: qty 6.7

## 2025-01-04 MED ORDER — DIPHENHYDRAMINE HCL 12.5 MG/5ML PO ELIX: 12.5000 mg | ORAL_SOLUTION | Freq: Four times a day (QID) | ORAL | Status: DC | PRN

## 2025-01-04 NOTE — Anesthesia Procedure Notes (Signed)
 Procedure Name: Intubation Date/Time: 01/04/2025 8:38 AM  Performed by: Belvie Valri NOVAK, CRNAPre-anesthesia Checklist: Patient identified, Emergency Drugs available, Suction available and Patient being monitored Patient Re-evaluated:Patient Re-evaluated prior to induction Oxygen Delivery Method: Circle System Utilized Preoxygenation: Pre-oxygenation with 100% oxygen Induction Type: IV induction Ventilation: Mask ventilation without difficulty and Oral airway inserted - appropriate to patient size Laryngoscope Size: 3 and Glidescope Grade View: Grade I Tube type: Oral Number of attempts: 1 Airway Equipment and Method: Stylet and Oral airway Placement Confirmation: ETT inserted through vocal cords under direct vision, positive ETCO2 and breath sounds checked- equal and bilateral Secured at: 20 cm Tube secured with: Tape Dental Injury: Teeth and Oropharynx as per pre-operative assessment

## 2025-01-04 NOTE — Anesthesia Postprocedure Evaluation (Signed)
"   Anesthesia Post Note  Patient: Ronal KATHEE Almas  Procedure(s) Performed: NEPHROURETERECTOMY, ROBOT-ASSISTED, LAPAROSCOPIC (Left) CYSTECTOMY, ROBOT-ASSISTED, WITH ILEAL CONDUIT CREATION LYMPHADENECTOMY, PELVIS, ROBOT-ASSISTED     Patient location during evaluation: PACU Anesthesia Type: General Level of consciousness: awake Pain management: pain level controlled Vital Signs Assessment: post-procedure vital signs reviewed and stable Respiratory status: spontaneous breathing, nonlabored ventilation and respiratory function stable Cardiovascular status: blood pressure returned to baseline and stable Postop Assessment: no apparent nausea or vomiting Anesthetic complications: no   No notable events documented.  Last Vitals:  Vitals:   01/04/25 1645 01/04/25 1655  BP: 132/81   Pulse: 74   Resp: 18   Temp:  (!) 36.3 C  SpO2: 97%     Last Pain:  Vitals:   01/04/25 1746  TempSrc:   PainSc: Asleep                 Darra Rosa P Adrionna Delcid      "

## 2025-01-04 NOTE — H&P (Signed)
 [name] is an 64 y.o. female.    Chief Complaint: Pre-Op LEFT Nephroureterctomy / Cystoectomy / TAH / BSO Cutaneous Ureterosctomy  HPI:    1 - Multifocal Recurrent High Grade Urothelial Carcinoma - T1/2 high grade urothlial carcinoma by TURBT 08/2022 by Dr. Sherrilee. CT localized, singl ureters bilterally. She has h/o TAH/XRT/node dissection for h/o cervical cancer. Remote 20PY smoker. Cr 2's per report and not cisplatinum candidate per medical oncology. Not candidate for signifciant pelvic radiation given already high dose in area. Multiple providers have discussed radical surgery with her.  Recent Course: 10/2022 - Restaging TURBT - CIS only, muscle not involved with aggressive deep sampling ==> Induction BCG x 6 12/2023 - TURBT - T1G3, large volume CIS, negative muscle ==> declines cystectomy or keytruda 09/2024 - TURBT - T1G3 bladder + NEW high grade left mid ureteral tumor (above iliacs) + 7x24 contour stent placed 11/2024 - CT c/a/p - no overt mets, preserved fat planes anteriorly, Cr 1.1   PMH sig for mild obesity, TAH/XRT/Node Dissection for cervical cancer (pfannensteil) ,COPD (not limiting, quit smoking), C spine fusion. Her husband [name] is very involved. Her PCP is Daisy Mace PA  Today [name] is seen to proceed with major extirpative surgey for multifocal BCG-refractory high grade urothelial carcinoma. COmpleted bowel prep to clear. Hgb 10.8 , Cr 1.1 this admission.   Past Medical History:  Diagnosis Date   Asthma    rarely uses   Cancer [facility]) 07/2022   Bladder cancer, treated with 6 treatments of BCG, follows w/ [facility].   Cervical cancer (HCC) 1992   treated w/ hysterectomy and radiation   Depression    GERD (gastroesophageal reflux disease)    no longer suffering from   Hyperlipidemia    Hypertension    Follows w/ Daisy Mace, PA.   IFG (impaired fasting glucose)    Neuromuscular disorder (HCC) 2023   Patient has numbness and tingling  in arms and hands from back surgery.   Obesity    Palpitations    Peripheral vertigo    Pneumonia    about 20 years ago per pt on 01/08/24   Thyroid disease    hx of many years ago   Wears dentures    upper and lower   Wears glasses     Past Surgical History:  Procedure Laterality Date   ABDOMINAL HYSTERECTOMY  1992   ANTERIOR CERVICAL DECOMP/DISCECTOMY FUSION N/A 09/30/2022   Procedure: CERVICAL FOUR-FIVE, CERVICAL FIVE-SIX, CERVICAL SIX-SEVEN ANTERIOR CERVICAL DECOMPRESSION/DISCECTOMY FUSION;  Surgeon: Daisy Debby LABOR, MD;  Location: [facility];  Service: Neurosurgery;  Laterality: N/A;   APPENDECTOMY  1992   CYSTOSCOPY  01/13/2024   Procedure: CYSTOSCOPY;  Surgeon: [name]., MD;  Location: [facility];  Service: Urology;;   CYSTOSCOPY W/ RETROGRADES Bilateral 08/26/2022   Procedure: CYSTOSCOPY WITH RETROGRADE PYELOGRAM;  Surgeon: Sherrilee Belvie CROME, MD;  Location: [facility];  Service: Urology;  Laterality: Bilateral;   CYSTOSCOPY W/ RETROGRADES Bilateral 11/07/2022   Procedure: CYSTOSCOPY WITH RETROGRADE PYELOGRAM;  Surgeon: Daisy Ricardo, MD;  Location: [facility];  Service: Urology;  Laterality: Bilateral;   CYSTOSCOPY W/ RETROGRADES Bilateral 10/07/2024   Procedure: CYSTOSCOPY, LEFT URETEROSCOPY WITH BIOPSY, WITH BILATERAL RETROGRADE PYELOGRAMS WITH LEFT STENT PLACEMENT;  Surgeon: [name]., MD;  Location: [facility];  Service: Urology;  Laterality: Bilateral;   TRANSURETHRAL RESECTION OF BLADDER TUMOR N/A 08/26/2022   Procedure: TRANSURETHRAL RESECTION OF BLADDER TUMOR (TURBT);  Surgeon: Sherrilee Belvie  L, MD;  Location: [facility];  Service: Urology;  Laterality: N/A;   TRANSURETHRAL RESECTION OF BLADDER TUMOR N/A 11/07/2022   Procedure: TRANSURETHRAL RESECTION OF BLADDER TUMOR (TURBT);  Surgeon: Daisy Hummer, MD;  Location: [facility];  Service: Urology;  Laterality: N/A;  1 HR   TRANSURETHRAL  RESECTION OF BLADDER TUMOR Bilateral 01/13/2024   Procedure: TRANSURETHRAL RESECTION OF BLADDER TUMOR (TURBT) BILATERAL RETROGRADE PYELOGRAM;  Surgeon: [name]., MD;  Location[facility];  Service: Urology;  Laterality: Bilateral;   TRANSURETHRAL RESECTION OF BLADDER TUMOR N/A 10/07/2024   Procedure: TURBT (TRANSURETHRAL RESECTION OF BLADDER TUMOR);  Surgeon: [name]., MD;  Location: [facility];  Service: Urology;  Laterality: N/A;  LARGE    Family History  Problem Relation Age of Onset   Cancer Mother        colon   Stroke Mother    Heart disease Father    Heart disease Maternal Grandmother    Cancer Maternal Grandfather        colon   Cancer Son        Leukemia   Social History:  reports that she quit smoking about 34 years ago. Her smoking use included cigarettes. She has never used smokeless tobacco. She reports that she does not drink alcohol and does not use drugs.  Allergies: Allergies[1]  Medications Prior to Admission  Medication Sig Dispense Refill   acetaminophen  (TYLENOL ) 325 MG tablet Take 650 mg by mouth 2 (two) times daily as needed for mild pain.     albuterol  (VENTOLIN  HFA) 108 (90 Base) MCG/ACT inhaler Inhale 1-2 puffs into the lungs every 6 (six) hours as needed for wheezing or shortness of breath.     Cyanocobalamin  (VITAMIN B12 PO) Take 1 tablet by mouth daily. (Patient not taking: Reported on 12/30/2024)     hydrochlorothiazide  (HYDRODIURIL ) 25 MG tablet Take 25 mg by mouth daily. (Patient not taking: No sig reported)     senna-docusate (SENOKOT-S) 8.6-50 MG tablet Take 1 tablet by mouth 2 (two) times daily. While taking strong pain meds to prevent constipation. (Patient not taking: Reported on 11/07/2024) 10 tablet 0    Results for orders placed or performed during the hospital encounter of 01/03/25 (from the past 48 hours)  Type and screen     Status: None   Collection Time: 01/03/25  7:59 PM  Result Value Ref  Range   ABO/RH(D) O POS    Antibody Screen NEG    Sample Expiration      01/06/2025,2359 Performed at [facility], [address] [place], [place] [address]   CBC     Status: Abnormal   Collection Time: 01/03/25  8:05 PM  Result Value Ref Range   WBC 5.3 4.0 - 10.5 K/uL   RBC 3.84 (L) 3.87 - 5.11 MIL/uL   Hemoglobin 10.8 (L) 12.0 - 15.0 g/dL   HCT 65.6 (L) 63.9 - 53.9 %   MCV 89.3 80.0 - 100.0 fL   MCH 28.1 26.0 - 34.0 pg   MCHC 31.5 30.0 - 36.0 g/dL   RDW 85.7 88.4 - 84.4 %   Platelets 201 150 - 400 K/uL   nRBC 0.0 0.0 - 0.2 %    Comment: Performed at [facility], [address], [place], [place] [address]  Basic metabolic panel     Status: Abnormal   Collection Time: 01/03/25  8:05 PM  Result Value Ref Range   Sodium 141  135 - 145 mmol/L   Potassium 3.7 3.5 - 5.1 mmol/L   Chloride 107 98 - 111 mmol/L   CO2 24 22 - 32 mmol/L   Glucose, Bld 91 70 - 99 mg/dL    Comment: Glucose reference range applies only to samples taken after fasting for at least 8 hours.   BUN 15 8 - 23 mg/dL   Creatinine, Ser 8.83 (H) 0.44 - 1.00 mg/dL   Calcium 88.9 (H) 8.9 - 10.3 mg/dL   GFR, Estimated 52 (L) >60 mL/min    Comment: (NOTE) Calculated using the CKD-EPI Creatinine Equation (2021)    Anion gap 11 5 - 15    Comment: Performed at [facility], [address], [place], [place] [address]   No results found.  Review of Systems  Constitutional:  Negative for chills and fever.  All other systems reviewed and are negative.   Blood pressure (!) 151/90, pulse 96, temperature 99 F (37.2 C), temperature source Oral, resp. rate 17, height 5' 7 (1.702 m), weight 78.5 kg, last menstrual period 02/05/1991, SpO2 98%. Physical Exam Vitals reviewed.  HENT:     Head: Normocephalic.     Right Ear: Tympanic membrane normal.  Eyes:     Pupils: Pupils are equal, round, and reactive to light.  Cardiovascular:     Rate and Rhythm: Normal  rate.  Pulmonary:     Effort: Pulmonary effort is normal.  Abdominal:     General: Abdomen is flat.     Comments: Stable truncal obesity. Stomal marking site noted.   Genitourinary:    Comments: No CVAT at present Musculoskeletal:        General: Normal range of motion.     Cervical back: Normal range of motion.  Skin:    General: Skin is warm.  Neurological:     General: No focal deficit present.     Mental Status: She is alert.  Psychiatric:        Mood and Affect: Mood normal.     Assessment/Plan  Proceed as planned with LEFT nerpho-ureterctomy, pelvic exenteration (cystectomy / TAH / BSO / node dissection) with ERt cutaneous ureterostomy. Risks, benefits, alternatives, expected peri-op course discussed extensivelyi previously and reiterated today.   Daniel KATHEE [name]., MD 01/04/2025, 6:38 AM        [1] Allergies Allergen Reactions   Augmentin [Amoxicillin-Pot Clavulanate] Diarrhea and Nausea And Vomiting    Pt decreased dose to QD HS and tolerated well

## 2025-01-04 NOTE — Anesthesia Preprocedure Evaluation (Addendum)
"                                    Anesthesia Evaluation  Patient identified by MRN, date of birth, ID band Patient awake    Reviewed: Allergy & Precautions, NPO status , Patient's Chart, lab work & pertinent test results  Airway Mallampati: II  TM Distance: >3 FB Neck ROM: Full    Dental  (+) Edentulous Upper, Edentulous Lower   Pulmonary asthma , former smoker   Pulmonary exam normal        Cardiovascular hypertension, Pt. on medications Normal cardiovascular exam     Neuro/Psych  PSYCHIATRIC DISORDERS  Depression     Neuromuscular disease    GI/Hepatic negative GI ROS, Neg liver ROS,,,  Endo/Other  negative endocrine ROS    Renal/GU Renal disease     Musculoskeletal negative musculoskeletal ROS (+)    Abdominal   Peds  Hematology  (+) Blood dyscrasia, anemia   Anesthesia Other Findings BLADDER CANCER  Reproductive/Obstetrics                              Anesthesia Physical Anesthesia Plan  ASA: 3  Anesthesia Plan: General   Post-op Pain Management:    Induction: Intravenous  PONV Risk Score and Plan: 3 and Ondansetron , Dexamethasone , Midazolam  and Treatment may vary due to age or medical condition  Airway Management Planned: Oral ETT  Additional Equipment:   Intra-op Plan:   Post-operative Plan: Extubation in OR  Informed Consent: I have reviewed the patients History and Physical, chart, labs and discussed the procedure including the risks, benefits and alternatives for the proposed anesthesia with the patient or authorized representative who has indicated his/her understanding and acceptance.     Dental advisory given  Plan Discussed with: CRNA  Anesthesia Plan Comments:          Anesthesia Quick Evaluation  "

## 2025-01-04 NOTE — Brief Op Note (Signed)
 01/04/2025  8:30 AM  2:46 PM  PATIENT:  [name]  64 y.o. female  PRE-OPERATIVE DIAGNOSIS:  BLADDER CANCER  POST-OPERATIVE DIAGNOSIS:  BLADDER CANCER  PROCEDURE:  Procedures: NEPHROURETERECTOMY, ROBOT-ASSISTED, LAPAROSCOPIC (Left) CYSTECTOMY, ROBOT-ASSISTED, WITH ILEAL CONDUIT CREATION (N/A) LYMPHADENECTOMY, PELVIS, ROBOT-ASSISTED (N/A)  SURGEON:  Surgeons and Role:    * Manny, Ricardo KATHEE Raddle., MD - Primary  PHYSICIAN ASSISTANT:   ASSISTANTS: Alan Hammonds PA   ANESTHESIA:   local and general  EBL:  500 mL   BLOOD ADMINISTERED:none  DRAINS: JP to bulb; RLQ cutaneous ureterostomy to gravity   LOCAL MEDICATIONS USED:  MARCAINE      SPECIMEN:  Source of Specimen:  Rt ureteral margin. Left kidney / ureter / nodes, Bladder  DISPOSITION OF SPECIMEN:  PATHOLOGY  COUNTS:  YES  TOURNIQUET:  * No tourniquets in log *  DICTATION: .Other Dictation: Dictation Number [id number]  PLAN OF CARE: Admit to inpatient   PATIENT DISPOSITION:  PACU - hemodynamically stable.   Delay start of Pharmacological VTE agent (>24hrs) due to surgical blood loss or risk of bleeding: yes

## 2025-01-04 NOTE — Transfer of Care (Signed)
 Immediate Anesthesia Transfer of Care Note  Patient: [name]  Procedure(s) Performed: NEPHROURETERECTOMY, ROBOT-ASSISTED, LAPAROSCOPIC (Left) CYSTECTOMY, ROBOT-ASSISTED, WITH ILEAL CONDUIT CREATION LYMPHADENECTOMY, PELVIS, ROBOT-ASSISTED  Patient Location: PACU  Anesthesia Type:General  Level of Consciousness: awake  Airway & Oxygen Therapy: Patient Spontanous Breathing and Patient connected to nasal cannula oxygen  Post-op Assessment: Report given to RN and Post -op Vital signs reviewed and stable  Post vital signs: Reviewed and stable  Last Vitals:  Vitals Value Taken Time  BP 122/65 01/04/25 15:10  Temp    Pulse 78 01/04/25 15:12  Resp 15 01/04/25 15:12  SpO2 96 % 01/04/25 15:12  Vitals shown include unfiled device data.  Last Pain:  Vitals:   01/04/25 0715  TempSrc:   PainSc: 5          Complications: No notable events documented.

## 2025-01-04 NOTE — Discharge Instructions (Signed)

## 2025-01-04 NOTE — Op Note (Signed)
 NAMESHANENA, PELLEGRINO MEDICAL RECORD NO: [id number] ACCOUNT NO: [id number] DATE OF BIRTH: August 16, 1961 FACILITY: [facility] LOCATION: [id number] PHYSICIAN: Ricardo Likens, MD  Operative Report   DATE OF PROCEDURE: 01/04/2025  SURGEON: Ricardo Likens, MD  PREOPERATIVE DIAGNOSES:  1. Left ureteral cancer.  2. High-grade BCG refractory bladder cancer.   3. History of extensive pelvic radiation.  PROCEDURES PERFORMED:   1. Robotic-assisted laparoscopic left nephroureterectomy with retroperineal lymph node dissection.   2. Robotic radical cystectomy with right cutaneous ureterostomy urinary diversion and lymph node dissection.  ASSISTANT:  Alan Hammonds, PA.  ANESTHESIA:  General endotracheal anesthesia.  ESTIMATED BLOOD LOSS:  500 mL.  COMPLICATIONS:  None.  SPECIMENS:   1.  Left kidney with paraaortic lymph nodes proximal ureter and stent en bloc.  2. Left distal ureter with stent bladder anterior vaginal wall en bloc. 3. Right distal ureteral margin frozen negative. 4. Final right distal ureteral margin.  DRAINS:   1. Jackson-Pratt drain to bulb suction. 2. Right lower quadrant cutaneous ureterostomy to gravity drainage with right (red Bander) stent.  FINDINGS: 1. Significant intraabdominal adhesions with dense adhesions between the colon, lateral abdominal wall, anterior abdominal wall, as well as some adhesions of small bowel and omentum anteriorly. 2. Shoddy periaortic adenopathy on the left.  INDICATIONS:  The patient is a very pleasant 64 year old lady with remote history of advanced cervical cancer status post hysterectomy with apparent salpingo-oophorectomy as well as extensive pelvic radiation previously.  She was found on workup for  gross hematuria several years ago to have high-grade urothelial carcinoma of the bladder.  She has undergone previous transurethral resection as well as BCG induction and she is very compliant with surveillance of this.  She unfortunately has had   recurrent disease that has been non-muscle invasive but ever remains high grade and BCG refractory with additional foci including large volume in the left ureter.  Options were discussed including curative and noncurative pathways.  She was seen for  medical oncology to consider systemic options and felt not to be a cisplatin candidate due to her due to her marginal kidney function.  And it was felt that radical surgery with left nephroureterectomy and cystectomy would be really her only shot at  durable cure.  She was admitted yesterday for stoma marking and bowel prep and labs which were all favorable.  Informed consent was obtained and placed in the medical record.  DESCRIPTION OF PROCEDURE:  The patient being Mical Brun identified and verified and the procedure being left nephroureterectomy and radical cystectomy with right cutaneous ureterostomy was confirmed.  Procedure timeout was performed.  Intravenous  antibiotics were administered.  General endotracheal anesthesia was induced.  A Foley catheter was placed to straight drain.  Patient was then placed into a left side up full flank position with applying 15 degrees of table flexion, superior arm elevated  on an axillary roll, Sequential compression devices placed, bottom leg bent, top leg straight.  She was further fastened to operative table with 3-inch tape over foam padding across her supraxiphoid chest and her pelvis and a bean bag was deployed.  A  sterile field was created by prepping and draping the patient's entire left flank and abdomen using chlorhexidine  gluconate and then a high flow, low pressure pneumoperitoneum was obtained using a Veress needle technique in the left lower quadrant having  passed the aspiration and drop test.  An 8 mm robotic camera port was then placed in position approximately 1.5 handbreadths superior lateral to the umbilicus  on the left side.  Laparoscopic examination of the peritoneal cavity did reveal at  this point some relatively loose omental adhesions  to the anterior abdominal wall as well as some splenic adhesions, otherwise unremarkable.  Additional ports were then placed as follows.  Left subcostal 8 mm robotic port, left far lateral 8 mm robotic port approximately 1 handbreadth superior medial to  the anterior iliac spine.  Left paramedian inferior robotic port approximately between the umbilicus and the previous lateral port, and two 12-mm assistant port sites in the midline, 1 approximately 2 fingerbreadths superior to the umbilicus and another  one approximately 4 fingerbreadths superior to this with the superior one being AirSeal type.  Robot was docked and passed the electronic checks.  Attention was then turned towards development of the retroperitoneum superolateral to the descending colon from the area of the splenic flexure towards the area of the internal ring. The colon was carefully swept medially.  Towards the area of the  pelvis, there was significant desmoplasia around the descending colon and exquisitely careful adhesiolysis was performed freeing it from the lateral abdominal wall allowing this to fall medially.  Lateral splenic attachments were taken down allowing the  spleen to rotate medially.  Lower pole of the kidney was then identified and placed on gentle lateral traction and dissection proceeded medial to this.  Ureter and gonadal vessels were encountered, swept laterally, and the aorta was then encountered as  well as psoas musculature.  Given some questionable adenopathy on preoperative imaging, a plane was chosen directly on the surface of the aorta from an area approximately 2 cm beneath the lower pole of the kidney superiorly past the area of the hilum,  thus performing very aggressive retroperitoneal lymph node dissection en bloc.  Lymphostasis was very carefully achieved using multiple Hem-o-Lok clips.  Renal hilum consisted of a single dominant artery, a single vein,  renal vascular anatomy as  anticipated.  The artery was controlled using extra large Hem-o-Lok clip proximal, vascular stapler distal, and then vascular stapler on the vein.  There was a quite prominent separate smaller adrenal artery that was controlled using Hem-o-Lok clip, 2 proximal,  1 distal.  Medial adrenal attachments were taken down using vascular stapler.  Anterior and superior attachments were taken down with cautery scissors.  So that the medial plane had been completely developed.  Ureter was once again identified at the level of the pole, and mobilized distally to the area of the iliac crossing.  In the area of the true pelvis, there was significant desmoplasia around this and exquisitely careful dissection was performed to avoid any vascular injury or injury to the ureter itself.   Once we got to a level that I felt was below the area of prior gross ureteral tumor, ureter was doubly clipped and purposely ligated with the proximal ureter and stent remaining with the kidney and distal ureter and distal end of stent to be later  dissected out with the bladder portion.  Lateral attachments were taken down with cautery scissors, thus completely exposed and mobilized the left kidney plus proximal ureter and retroperitoneal lymph nodes en bloc which was placed in an EndoCatch bag  for later retrieval.  With the specimen string being brought through the inferior most 12-millimeter assistant port site.  Robot was undocked.  The superior 12-millimeter assistant port site was closed with fascia using a Carter-Thomason suture passer  using a 0 Vicryl.  And the subcostal 8-millimeter robotic port was closed.  The  remaining 3 inferior ports were covered using Ioban.  Patient was then repositioned into a low lithotomy position after removing the beanbag from behind her.  She was again  further fastened to the operating table using 3-inch tape with foam padding across her supraxiphoid chest and the in situ  Foley catheter was removed.  A new sterile field was created by prepping and draping the patient's vagina, introitus, and proximal  thigh using iodine.  And her infraxiphoid chest was prepped using chlorhexidine  gluconate once again after the Ioban was removed.  An LAVH type drape was placed.  She was placed in a steep Trendelenburg positioning and suitably positioned.   Pneumoperitoneum was reestablished by placing a 12-millimeter sleeve and blunt trocar through the previous superior umbilical midline site.  Pneumoperitoneum was easily reestablished.  Using a piggyback technique, robotic ports were placed through this.   Additional ports were placed as follows: right paramedian 8-millimeter robotic port, right far lateral 12-millimeter AirSeal assistant port, and right paramedian 15-millimeter port at the previously marked stomal site.  Robot was once again re-docked.   Additional adhesiolysis was performed in the deep pelvis, some omental adhesions mostly towards the anterior abdominal wall as well as a loop of small bowel that was again adhesed to the anterior abdominal wall and exquisite care was taken to avoid any  bowel injury, which did not occur.  This allowed all the small bowel to fall out of the true pelvis.  The sigmoid was quite redundant.  And there was significant areas of adhesions of the sigmoid to the right lateral pelvis and then deep pelvis on the  left.  These were exquisitely carefully released.  And there was no evidence of rectal or sigmoid violation noted.  This was quite favorable.  There appeared to be sufficient mobility and visualization of the pelvis to proceed with the cystectomy  portion.  The right ureter was dissected free at its course over the iliac vessels, marked with a vessel loop, dissected proximally to the area of the gonadal stump and then distally to the ureterovesical junction where it was clipped and ligated.   Proximal loop containing a white tack suture.  This  was brought out of the true pelvis.  There was some significant desmoplasia around the ureter as anticipated given the radiation; however, it did appear to be sufficiently viable and vascular for later  cutaneous ureterostomy.  Frozen section negative for carcinoma.  Lateral bladder attachments were again carefully taken down towards the area of the umbilical fascia bilaterally.  Sponge stick was placed in the vagina and a purposeful incision was made  directly onto the vaginal cuff apex.  And the full liner of the vagina was carefully incised to create a large posterior cuff and allow the anterior vaginal wall to move away from this.  This exposed the vascular pedicles of the bladder which were  controlled using vascular stapler x 1 each side.  The bladder was somewhat small and contracted; therefore, it only required approximately 40 millimeters worth of length on each side.  Space of Retzius was then redeveloped between the medial umbilical  ligaments.  Again, this plane was incredibly desmoplastic and exquisite care was taken to avoid bladder perforation, which did not occur.  The urethra was completely excised.  In situ Foley catheter removed.  This really freed up the bladder with left  distal ureter specimen which was placed into a separate EndoCatch bag for later retrieval.  The vaginal cuff did appear to  be of sufficient length, vascularity, and mobility for a clamshell technique closure.  And double-armed 2-0 9-inch V-Loc was used,  first at the 2 o'clock position and then creating 2 separate running suture lines at the 12 o'clock to the 6 o'clock position respectively which allowed excellent tension-free clamshell closure of the vaginal cuff.  A separate imbricating layer was  performed on each side given the significant radiation to the field.  Next, vaginal exam was performed using an indicator glove.  No evidence of residual vaginal defect was noted.  And digital rectal exam was then performed  using an indicator glove and  no evidence of rectal violation was noted.  I was quite happy with the safety and completeness of the extirpated portion of the surgery.  A closed suction drain was brought through the previous left far lateralmost robotic port site into the area of the  peritoneal cavity.  The right ureter with tag clip was grasped with a self-locking laparoscopic grasper through the 15-millimeter port site.  Patient was then made level.  Robot was undocked.  Specimens were retrieved by extending the previous midline port site for a total distance of approximally  5 cm.  And removing the  left kidney and proximal ureter as well as the bladder distal left ureter specimens respectively, setting aside for permanent pathology.  Again, there appeared to be sufficient length and geometry of the right ureter to allow for a very simple cutaneous  ureterostomy, which was the plan and to avoid bowel anastomosis with the patient.  A Y-shaped incision was made off the previous 15-millimeter site.  And this was dilated with the fascia to accommodate 2 surgeon's fingers.  The ureter was brought through  this.  Ureter was spatulated for approximately 1.5 cm.  Final specimen sent aside for pathology.  A heel stitch of 4-0 Vicryl was in place followed by a red colored Bander stent of 27 cm to the ureter skin junction.  And continuous ureterostomy was then  performed by running the spatulated ureter in a mirror image V-Y type orientation to the 15-millimeter port site to increase surface area and prevent cutaneous stricturing.  I was quite happy with the geometry of this.  The stent was then anchored using  purposeful 5-0 suture through and through.  The abdomen was once again inspected via the extraction site.  Sponge and needle counts were correct.  Hemostasis was excellent.  Omentum was brought over this.  The extraction site was closed at the level of  the fascia using a figure-of-eight PDS x 5 followed by  reapproximation of the Scarpa's with running Vicryl.  All other incision sites were irrigated with dilute lipolyzed Marcaine  and closed at the level of the skin using  subcuticular Monocryl followed by dermabond.   Procedure was then terminated.  Patient tolerated the procedure well with no immediate periprocedural complications.  Patient was taken to postanesthesia care unit in stable condition.  Plan for inpatient admission.  Please note, first assistant Alan Hammonds was crucial for all portions of the procedure today; she provided invaluable retraction, suctioning, robotic instrument exchange, vascular clipping, vascular stapling and general first assistance.    MUK D: 01/04/2025 3:03:48 pm T: 01/04/2025 9:19:00 pm  JOB: [id number]/ [id number]

## 2025-01-05 ENCOUNTER — Encounter (HOSPITAL_COMMUNITY): Payer: Self-pay | Admitting: Urology

## 2025-01-05 LAB — BASIC METABOLIC PANEL WITH GFR
Anion gap: 9 (ref 5–15)
BUN: 18 mg/dL (ref 8–23)
CO2: 24 mmol/L (ref 22–32)
Calcium: 8.9 mg/dL (ref 8.9–10.3)
Chloride: 107 mmol/L (ref 98–111)
Creatinine, Ser: 1.67 mg/dL — ABNORMAL HIGH (ref 0.44–1.00)
GFR, Estimated: 34 mL/min — ABNORMAL LOW
Glucose, Bld: 111 mg/dL — ABNORMAL HIGH (ref 70–99)
Potassium: 4.2 mmol/L (ref 3.5–5.1)
Sodium: 140 mmol/L (ref 135–145)

## 2025-01-05 LAB — HEMOGLOBIN AND HEMATOCRIT, BLOOD
HCT: 30.9 % — ABNORMAL LOW (ref 36.0–46.0)
Hemoglobin: 9.4 g/dL — ABNORMAL LOW (ref 12.0–15.0)

## 2025-01-05 MED ORDER — BOOST / RESOURCE BREEZE PO LIQD CUSTOM
1.0000 | Freq: Three times a day (TID) | ORAL | Status: DC
  Administered 2025-01-05 – 2025-01-08 (×8): 1 via ORAL

## 2025-01-05 NOTE — Consult Note (Addendum)
 WOC Nurse ostomy follow up Pt had ureterostomy surgery performed yesterday. Current pouch is intact with good seal, 1 stint visible, mod amt bloody urine.  Educational materials left at the bedside and ordered 5 sets of supplies to the room for staff nurses' use: Use Supplies: barrier ring, Lawson # [phone] and convex justus Collum # [phone]  Discussed pouching routines with patient. She requests I perform pouch change demonstration and teaching session with her when her family member is present tomorrow morning at 0800. Placed on Secure Start: NOT YET   Thank-you,  Stephane Fought MSN, RN, CWOCN, CWCN-AP, CNS Contact Mon-Fri 0700-1500: [phone]

## 2025-01-05 NOTE — Progress Notes (Signed)
" °   01/05/25 0827  TOC Brief Assessment  Insurance and Status Reviewed  Patient has primary care physician Yes  Home environment has been reviewed Resides with spouse  Prior level of function: Independent with ADLs at baseline  Prior/Current Home Services No current home services  Social Drivers of Health Review SDOH reviewed no interventions necessary  Readmission risk has been reviewed Yes  Transition of care needs no transition of care needs at this time    "

## 2025-01-05 NOTE — Progress Notes (Signed)
 1 Day Post-Op   Subjective/Chief Complaint:  1- Left Ureteral / Bladder Cancer- s/p LEFT robotic nephroureteretomy  / retroperitoneal node dissection, cystectomy, right cutaneous ureterostomy, extensive adhesiolysis 01/04/25. Admitted day prior for labs, stomal marking, bowel prep. Path pending. Clears POD 0 ,Fulls POD 1.   2 - Rt Cutaneous Ureterostomy - Rt ureter to skin diversion following surgery above. Osotmy RN team working with house.  Today [name] is stable. Walked this mornign and first post-op ostomy team visit, planning on ostomy change tomorrow with husband. No fevers or emesis.   Objective: Vital signs in last 24 hours: Temp:  [97.4 F (36.3 C)-98.6 F (37 C)] 97.8 F (36.6 C) (01/15 0911) Pulse Rate:  [69-102] 82 (01/15 0911) Resp:  [9-18] 16 (01/15 0911) BP: (99-132)/(64-81) 117/64 (01/15 0911) SpO2:  [92 %-100 %] 92 % (01/15 0911) Last BM Date : 01/05/25  Intake/Output from previous day: 01/14 0701 - 01/15 0700 In: 3400.1 [P.O.:480; I.V.:2720.1; IV Piggyback:200] Out: 1540 [Urine:780; Drains:260; Blood:500] Intake/Output this shift: Total I/O In: -  Out: 150 [Drains:150]  NAD, AOx3 NLB-RA SNTND, recent surgical sites c/d/I, Jp with non-foul serosanguinous fluid. RLQ cutaneous ureterosctomy patent of copius non-foul pink urine, distal bander stent in place Minimal vaginal spotting No c/c/e, SCD's in place  Lab Results:  Recent Labs    01/03/25 2005 01/04/25 1530 01/05/25 0536  WBC 5.3  --   --   HGB 10.8* 11.0* 9.4*  HCT 34.3* 35.1* 30.9*  PLT 201  --   --    BMET Recent Labs    01/03/25 2005 01/05/25 0536  NA 141 140  K 3.7 4.2  CL 107 107  CO2 24 24  GLUCOSE 91 111*  BUN 15 18  CREATININE 1.16* 1.67*  CALCIUM 11.0* 8.9   PT/INR No results for input(s): LABPROT, INR in the last 72 hours. ABG No results for input(s): PHART, HCO3 in the last 72 hours.  Invalid input(s): PCO2, PO2  Studies/Results: No results  found.  Anti-infectives: Anti-infectives (From admission, onward)    Start     Dose/Rate Route Frequency Ordered Stop   01/04/25 1300  piperacillin -tazobactam (ZOSYN ) IVPB 3.375 g        3.375 g 100 mL/hr over 30 Minutes Intravenous  Once 01/04/25 1248     01/04/25 1030  piperacillin -tazobactam (ZOSYN ) IVPB 3.375 g        3.375 g 100 mL/hr over 30 Minutes Intravenous  Once 01/04/25 1021     01/04/25 0745  piperacillin -tazobactam (ZOSYN ) IVPB 3.375 g        3.375 g 100 mL/hr over 30 Minutes Intravenous  Once 01/04/25 0110 01/04/25 1126   01/03/25 2100  neomycin  (MYCIFRADIN ) tablet 500 mg        500 mg Oral Every 4 hours 01/03/25 2011 01/04/25 0058   01/03/25 2100  metroNIDAZOLE  (FLAGYL ) tablet 500 mg        500 mg Oral Every 4 hours 01/03/25 2011 01/04/25 0157       Assessment/Plan:  Doing well POD 1. Goals for DC discussed, likely at least 2 more nights. Appreciate ostomy RN team help. Fulls today, ambulate at least BID.   [name]. 01/05/2025

## 2025-01-06 LAB — BASIC METABOLIC PANEL WITH GFR
Anion gap: 8 (ref 5–15)
BUN: 16 mg/dL (ref 8–23)
CO2: 25 mmol/L (ref 22–32)
Calcium: 9.2 mg/dL (ref 8.9–10.3)
Chloride: 104 mmol/L (ref 98–111)
Creatinine, Ser: 1.62 mg/dL — ABNORMAL HIGH (ref 0.44–1.00)
GFR, Estimated: 35 mL/min — ABNORMAL LOW
Glucose, Bld: 105 mg/dL — ABNORMAL HIGH (ref 70–99)
Potassium: 3.7 mmol/L (ref 3.5–5.1)
Sodium: 137 mmol/L (ref 135–145)

## 2025-01-06 LAB — HEMOGLOBIN AND HEMATOCRIT, BLOOD
HCT: 29.2 % — ABNORMAL LOW (ref 36.0–46.0)
Hemoglobin: 9.2 g/dL — ABNORMAL LOW (ref 12.0–15.0)

## 2025-01-06 MED ORDER — ACETAMINOPHEN 500 MG PO TABS
1000.0000 mg | ORAL_TABLET | Freq: Three times a day (TID) | ORAL | Status: DC
  Administered 2025-01-06 – 2025-01-08 (×8): 1000 mg via ORAL
  Filled 2025-01-06 (×8): qty 2

## 2025-01-06 NOTE — Consult Note (Addendum)
 WOC Nurse ostomy follow up Pt had ureterostomy, surgery performed 1/14.  Pouch change demonstration and teaching session with Pt and husband at the bedside.  Right abd with small slit surrounded by sutures, no stoma, 1 stint sutured in place.  Current pouch is leaking behind the barrier. Will apply barrier ring and one piece convex pouch to attempt to maintain a seal.  Husband was able to stretch and apply the barrier ring and one piece pouch with minimal assistance.  Both of them were able to connect and disconnect bedside drainage bag and open and close to empty. Pt watched using a hand held mirror and asked appropriate questions. Reviewed pouching routines and ordering supplies.  5 sets of supplies left at the bedside for staff nurses' use: Use Supplies: barrier ring, Gerlean # [phone] and convex justus Gerlean # [phone]   Enrolled patient in [place] Secure Start Discharge program: Yes, today  WOC team will see again on Monday for another teaching session.   Pt could benefit from follow-up at the outpatient ostomy clinic after discharge; surgery team; please make a referral if you agree.   Thank-you,  Stephane Fought MSN, RN, CWOCN, CWCN-AP, CNS Contact Mon-Fri 0700-1500: [phone]

## 2025-01-06 NOTE — Plan of Care (Signed)
" °  Problem: Education: Goal: Knowledge of General Education information will improve Description: Including pain rating scale, medication(s)/side effects and non-pharmacologic comfort measures Outcome: Progressing   Problem: Health Behavior/Discharge Planning: Goal: Ability to manage health-related needs will improve Outcome: Progressing   Problem: Clinical Measurements: Goal: Ability to maintain clinical measurements within normal limits will improve Outcome: Progressing Goal: Will remain free from infection Outcome: Progressing Goal: Diagnostic test results will improve Outcome: Progressing Goal: Respiratory complications will improve Outcome: Progressing Goal: Cardiovascular complication will be avoided Outcome: Progressing   Problem: Activity: Goal: Risk for activity intolerance will decrease Outcome: Progressing   Problem: Nutrition: Goal: Adequate nutrition will be maintained Outcome: Progressing   Problem: Coping: Goal: Level of anxiety will decrease Outcome: Progressing   Problem: Elimination: Goal: Will not experience complications related to bowel motility Outcome: Progressing Goal: Will not experience complications related to urinary retention Outcome: Progressing   Problem: Pain Managment: Goal: General experience of comfort will improve and/or be controlled Outcome: Progressing   Problem: Safety: Goal: Ability to remain free from injury will improve Outcome: Progressing   Problem: Skin Integrity: Goal: Risk for impaired skin integrity will decrease Outcome: Progressing   Problem: Education: Goal: Knowledge of the prescribed therapeutic regimen will improve Outcome: Progressing   Problem: Bowel/Gastric: Goal: Gastrointestinal status for postoperative course will improve Outcome: Progressing   Problem: Cardiac: Goal: Ability to maintain an adequate cardiac output Outcome: Progressing Goal: Will show no evidence of cardiac arrhythmias Outcome:  Progressing   Problem: Nutritional: Goal: Will attain and maintain optimal nutritional status Outcome: Progressing   Problem: Neurological: Goal: Will regain or maintain usual level of consciousness Outcome: Progressing   Problem: Clinical Measurements: Goal: Ability to maintain clinical measurements within normal limits Outcome: Progressing Goal: Postoperative complications will be avoided or minimized Outcome: Progressing   Problem: Respiratory: Goal: Will regain and/or maintain adequate ventilation Outcome: Progressing Goal: Respiratory status will improve Outcome: Progressing   Problem: Skin Integrity: Goal: Demonstrates signs of wound healing without infection Outcome: Progressing   Problem: Urinary Elimination: Goal: Will remain free from infection Outcome: Progressing Goal: Ability to achieve and maintain adequate urine output Outcome: Progressing   Problem: Education: Goal: Knowledge of the prescribed therapeutic regimen will improve Outcome: Progressing   Problem: Bowel/Gastric: Goal: Gastrointestinal status for postoperative course will improve Outcome: Progressing   Problem: Clinical Measurements: Goal: Postoperative complications will be avoided or minimized Outcome: Progressing   Problem: Respiratory: Goal: Ability to achieve and maintain a regular respiratory rate will improve Outcome: Progressing   Problem: Skin Integrity: Goal: Demonstration of wound healing without infection will improve Outcome: Progressing   Problem: Urinary Elimination: Goal: Ability to avoid or minimize complications of infection will improve Outcome: Progressing Goal: Ability to achieve and maintain urine output will improve Outcome: Progressing   "

## 2025-01-06 NOTE — Progress Notes (Signed)
 2 Days Post-Op   Subjective/Chief Complaint:  1- Left Ureteral / Bladder Cancer- s/p LEFT robotic nephroureteretomy  / retroperitoneal node dissection, cystectomy, right cutaneous ureterostomy, extensive adhesiolysis 01/04/25. Admitted day prior for labs, stomal marking, bowel prep. Path pending. Clears POD 0 ,Fulls POD 38m Reg diet POD 2.   2 - Rt Cutaneous Ureterostomy - Rt ureter to skin diversion following surgery above. Osotmy RN team working with house.  Today [name] is continuing to progress. Ambulating a bit more with minimal assist. No emesis on reg diet. Still working with ostomy team.    Objective: Vital signs in last 24 hours: Temp:  [98.3 F (36.8 C)-99.3 F (37.4 C)] 99 F (37.2 C) (01/16 1216) Pulse Rate:  [99-110] 110 (01/16 1216) Resp:  [16-18] 18 (01/16 1216) BP: (120-143)/(66-84) 127/84 (01/16 1216) SpO2:  [90 %-94 %] 92 % (01/16 1216) Last BM Date : 01/05/25  Intake/Output from previous day: 01/15 0701 - 01/16 0700 In: 1630.3 [I.V.:1330.3; IV Piggyback:300] Out: 1250 [Urine:700; Drains:550] Intake/Output this shift: Total I/O In: -  Out: 435 [Urine:225; Drains:210]   NAD, AOx3 NLB-RA SNTND, recent surgical sites c/d/I, Jp with non-foul serosanguinous fluid. RLQ cutaneous ureterosctomy patent of copius non-foul pink urine, distal bander stent in place Nil vaginal spotting No c/c/e, SCD's in place  Lab Results:  Recent Labs    01/03/25 2005 01/04/25 1530 01/05/25 0536 01/06/25 0557  WBC 5.3  --   --   --   HGB 10.8*   < > 9.4* 9.2*  HCT 34.3*   < > 30.9* 29.2*  PLT 201  --   --   --    < > = values in this interval not displayed.   BMET Recent Labs    01/05/25 0536 01/06/25 0557  NA 140 137  K 4.2 3.7  CL 107 104  CO2 24 25  GLUCOSE 111* 105*  BUN 18 16  CREATININE 1.67* 1.62*  CALCIUM 8.9 9.2   PT/INR No results for input(s): LABPROT, INR in the last 72 hours. ABG No results for input(s): PHART, HCO3 in the last 72  hours.  Invalid input(s): PCO2, PO2  Studies/Results: No results found.  Anti-infectives: Anti-infectives (From admission, onward)    Start     Dose/Rate Route Frequency Ordered Stop   01/04/25 1300  piperacillin -tazobactam (ZOSYN ) IVPB 3.375 g        3.375 g 100 mL/hr over 30 Minutes Intravenous  Once 01/04/25 1248     01/04/25 1030  piperacillin -tazobactam (ZOSYN ) IVPB 3.375 g        3.375 g 100 mL/hr over 30 Minutes Intravenous  Once 01/04/25 1021     01/04/25 0745  piperacillin -tazobactam (ZOSYN ) IVPB 3.375 g        3.375 g 100 mL/hr over 30 Minutes Intravenous  Once 01/04/25 0110 01/04/25 1126   01/03/25 2100  neomycin  (MYCIFRADIN ) tablet 500 mg        500 mg Oral Every 4 hours 01/03/25 2011 01/04/25 0058   01/03/25 2100  metroNIDAZOLE  (FLAGYL ) tablet 500 mg        500 mg Oral Every 4 hours 01/03/25 2011 01/04/25 0157       Assessment/Plan:  Doing well POD 2. Goals for DC rediscussed, likely 1-2 more nights. Appreciate ostomy RN team help. Reg diet today, ambulate at least BID. Discussed weekend rounding team and that possibe DC Sun v. Mon based on current progress.    [name]. 01/06/2025

## 2025-01-06 NOTE — Care Management Important Message (Signed)
 Important Message  Patient Details IM Letter given. Name: [name] MRN: [id number] Date of Birth: 1961-10-07   Important Message Given:  Yes - Medicare IM     Melba Ates 01/06/2025, 3:50 PM

## 2025-01-07 LAB — BASIC METABOLIC PANEL WITH GFR
Anion gap: 10 (ref 5–15)
BUN: 25 mg/dL — ABNORMAL HIGH (ref 8–23)
CO2: 27 mmol/L (ref 22–32)
Calcium: 9.8 mg/dL (ref 8.9–10.3)
Chloride: 99 mmol/L (ref 98–111)
Creatinine, Ser: 2.16 mg/dL — ABNORMAL HIGH (ref 0.44–1.00)
GFR, Estimated: 25 mL/min — ABNORMAL LOW
Glucose, Bld: 112 mg/dL — ABNORMAL HIGH (ref 70–99)
Potassium: 3.8 mmol/L (ref 3.5–5.1)
Sodium: 137 mmol/L (ref 135–145)

## 2025-01-07 LAB — HEMOGLOBIN AND HEMATOCRIT, BLOOD
HCT: 28.7 % — ABNORMAL LOW (ref 36.0–46.0)
Hemoglobin: 9.2 g/dL — ABNORMAL LOW (ref 12.0–15.0)

## 2025-01-07 MED ORDER — ALUM & MAG HYDROXIDE-SIMETH 200-200-20 MG/5ML PO SUSP
15.0000 mL | ORAL | Status: DC | PRN
  Administered 2025-01-07: 15 mL via ORAL
  Administered 2025-01-08 – 2025-01-09 (×2): 30 mL via ORAL
  Filled 2025-01-07 (×3): qty 30

## 2025-01-07 NOTE — Progress Notes (Signed)
 3 Days Post-Op   Subjective/Chief Complaint:  1- Left Ureteral / Bladder Cancer- s/p LEFT robotic nephroureteretomy  / retroperitoneal node dissection, cystectomy, right cutaneous ureterostomy, extensive adhesiolysis 01/04/25. Admitted day prior for labs, stomal marking, bowel prep. Path pending. Clears POD 0 ,Fulls POD 10m Reg diet POD 2.   2 - Rt Cutaneous Ureterostomy - Rt ureter to skin diversion following surgery above. Osotmy RN team working with house.  Today Dung is continuing to progress.  No significant pain. Mild nausea. No emesis on reg diet. Still working with ostomy team.  Hgb is stable.  Cr is up to 2.16.  JP output remains moderately high.    Objective: Vital signs in last 24 hours: Temp:  [97.5 F (36.4 C)-99 F (37.2 C)] 98.5 F (36.9 C) (01/17 0540) Pulse Rate:  [98-110] 104 (01/17 0540) Resp:  [16-18] 16 (01/17 0540) BP: (117-136)/(69-84) 136/79 (01/17 0540) SpO2:  [91 %-92 %] 91 % (01/17 0540) Last BM Date : 01/05/25  Intake/Output from previous day: 01/16 0701 - 01/17 0700 In: 240 [P.O.:240] Out: 851 [Urine:450; Drains:400; Stool:1] Intake/Output this shift: No intake/output data recorded.   NAD, AOx3 NLB-RA SNTND, recent surgical sites c/d/I, Jp with non-foul serosanguinous fluid. RLQ cutaneous ureterosctomy patent of copius non-foul pink urine, distal bander stent in place No c/c/e, SCD's in place  Lab Results:  Recent Labs    01/06/25 0557 01/07/25 0523  HGB 9.2* 9.2*  HCT 29.2* 28.7*   BMET Recent Labs    01/06/25 0557 01/07/25 0523  NA 137 137  K 3.7 3.8  CL 104 99  CO2 25 27  GLUCOSE 105* 112*  BUN 16 25*  CREATININE 1.62* 2.16*  CALCIUM 9.2 9.8   PT/INR No results for input(s): LABPROT, INR in the last 72 hours. ABG No results for input(s): PHART, HCO3 in the last 72 hours.  Invalid input(s): PCO2, PO2  Studies/Results: No results found.  Anti-infectives: Anti-infectives (From admission, onward)    Start      Dose/Rate Route Frequency Ordered Stop   01/04/25 1300  piperacillin -tazobactam (ZOSYN ) IVPB 3.375 g        3.375 g 100 mL/hr over 30 Minutes Intravenous  Once 01/04/25 1248     01/04/25 1030  piperacillin -tazobactam (ZOSYN ) IVPB 3.375 g        3.375 g 100 mL/hr over 30 Minutes Intravenous  Once 01/04/25 1021     01/04/25 0745  piperacillin -tazobactam (ZOSYN ) IVPB 3.375 g        3.375 g 100 mL/hr over 30 Minutes Intravenous  Once 01/04/25 0110 01/04/25 1126   01/03/25 2100  neomycin  (MYCIFRADIN ) tablet 500 mg        500 mg Oral Every 4 hours 01/03/25 2011 01/04/25 0058   01/03/25 2100  metroNIDAZOLE  (FLAGYL ) tablet 500 mg        500 mg Oral Every 4 hours 01/03/25 2011 01/04/25 0157       Assessment/Plan:  Doing well POD 3. Goals for DC rediscussed, likely 1 more night. Appreciate ostomy RN team help. Cont. Reg diet today, ambulate at least BID. Possibe DC Sun v. Mon based on current progress. Cr is rising.  JP cr today.    [name] 01/07/2025

## 2025-01-07 NOTE — Progress Notes (Signed)
 Patient alert and oriented 4. Care resumed at 0300. Patient ambulated to bathroom with one-person assist, minimal assistance. Assisted with bath. JP drain emptied for this shift with 15 mL output. Urostomy draining dark, bloody urine. Patient resting in bed. Plan of care ongoing.

## 2025-01-08 LAB — HEMOGLOBIN AND HEMATOCRIT, BLOOD
HCT: 29.2 % — ABNORMAL LOW (ref 36.0–46.0)
Hemoglobin: 9.4 g/dL — ABNORMAL LOW (ref 12.0–15.0)

## 2025-01-08 LAB — BASIC METABOLIC PANEL WITH GFR
Anion gap: 9 (ref 5–15)
BUN: 34 mg/dL — ABNORMAL HIGH (ref 8–23)
CO2: 28 mmol/L (ref 22–32)
Calcium: 10 mg/dL (ref 8.9–10.3)
Chloride: 97 mmol/L — ABNORMAL LOW (ref 98–111)
Creatinine, Ser: 2.11 mg/dL — ABNORMAL HIGH (ref 0.44–1.00)
GFR, Estimated: 25 mL/min — ABNORMAL LOW
Glucose, Bld: 104 mg/dL — ABNORMAL HIGH (ref 70–99)
Potassium: 3.9 mmol/L (ref 3.5–5.1)
Sodium: 134 mmol/L — ABNORMAL LOW (ref 135–145)

## 2025-01-08 NOTE — Progress Notes (Signed)
 4 Days Post-Op   Subjective/Chief Complaint:  1- Left Ureteral / Bladder Cancer- s/p LEFT robotic nephroureteretomy  / retroperitoneal node dissection, cystectomy, right cutaneous ureterostomy, extensive adhesiolysis 01/04/25. Admitted day prior for labs, stomal marking, bowel prep. Path pending. Clears POD 0 ,Fulls POD 26m Reg diet POD 2.   2 - Rt Cutaneous Ureterostomy - Rt ureter to skin diversion following surgery above. Osotmy RN team working with house.  Today [name] is continuing to progress.  No significant pain. No nausea. No emesis on reg diet. Still working with ostomy team.  Hgb is stable.  Cr is down slighlt to 2.11.  JP output remains moderate with about output overnight.   JP Cr is pending.  Pt states she doesn't feel ready to go yet. .    Objective: Vital signs in last 24 hours: Temp:  [97.7 F (36.5 C)-98.1 F (36.7 C)] 98.1 F (36.7 C) (01/18 0507) Pulse Rate:  [99-114] 102 (01/18 0507) Resp:  [16-18] 16 (01/18 0507) BP: (118-148)/(74-81) 148/81 (01/18 0507) SpO2:  [92 %-96 %] 92 % (01/18 0507) Last BM Date : 01/07/25  Intake/Output from previous day: 01/17 0701 - 01/18 0700 In: 340 [P.O.:340] Out: 265 [Drains:265] Intake/Output this shift: Total I/O In: -  Out: 370 [Urine:350; Drains:20]   NAD, AOx3 NLB-RA SNTND, recent surgical sites c/d/I, Jp with non-foul serosanguinous fluid. RLQ cutaneous ureterosctomy patent of copius non-foul pink urine, distal bander stent in place No c/c/e, SCD's in place  Lab Results:  Recent Labs    01/07/25 0523 01/08/25 0523  HGB 9.2* 9.4*  HCT 28.7* 29.2*   BMET Recent Labs    01/07/25 0523 01/08/25 0523  NA 137 134*  K 3.8 3.9  CL 99 97*  CO2 27 28  GLUCOSE 112* 104*  BUN 25* 34*  CREATININE 2.16* 2.11*  CALCIUM 9.8 10.0   PT/INR No results for input(s): LABPROT, INR in the last 72 hours. ABG No results for input(s): PHART, HCO3 in the last 72 hours.  Invalid input(s): PCO2,  PO2  Studies/Results: No results found.  Anti-infectives: Anti-infectives (From admission, onward)    Start     Dose/Rate Route Frequency Ordered Stop   01/04/25 1300  piperacillin -tazobactam (ZOSYN ) IVPB 3.375 g        3.375 g 100 mL/hr over 30 Minutes Intravenous  Once 01/04/25 1248     01/04/25 1030  piperacillin -tazobactam (ZOSYN ) IVPB 3.375 g        3.375 g 100 mL/hr over 30 Minutes Intravenous  Once 01/04/25 1021     01/04/25 0745  piperacillin -tazobactam (ZOSYN ) IVPB 3.375 g        3.375 g 100 mL/hr over 30 Minutes Intravenous  Once 01/04/25 0110 01/04/25 1126   01/03/25 2100  neomycin  (MYCIFRADIN ) tablet 500 mg        500 mg Oral Every 4 hours 01/03/25 2011 01/04/25 0058   01/03/25 2100  metroNIDAZOLE  (FLAGYL ) tablet 500 mg        500 mg Oral Every 4 hours 01/03/25 2011 01/04/25 0157       Assessment/Plan:  Doing well POD 4. Goals for DC rediscussed, likely 1 more night. Appreciate ostomy RN team help. Cont. Continue diet and activity. DC Monday based on current progress. Cr is rising.  JP pending and will hold off on removal until that is available since she is staying.     [name] 01/08/2025

## 2025-01-09 LAB — BASIC METABOLIC PANEL WITH GFR
Anion gap: 9 (ref 5–15)
BUN: 33 mg/dL — ABNORMAL HIGH (ref 8–23)
CO2: 28 mmol/L (ref 22–32)
Calcium: 9.3 mg/dL (ref 8.9–10.3)
Chloride: 99 mmol/L (ref 98–111)
Creatinine, Ser: 1.68 mg/dL — ABNORMAL HIGH (ref 0.44–1.00)
GFR, Estimated: 33 mL/min — ABNORMAL LOW
Glucose, Bld: 95 mg/dL (ref 70–99)
Potassium: 4 mmol/L (ref 3.5–5.1)
Sodium: 136 mmol/L (ref 135–145)

## 2025-01-09 LAB — HEMOGLOBIN AND HEMATOCRIT, BLOOD
HCT: 28.3 % — ABNORMAL LOW (ref 36.0–46.0)
Hemoglobin: 8.8 g/dL — ABNORMAL LOW (ref 12.0–15.0)

## 2025-01-09 MED ORDER — OXYCODONE-ACETAMINOPHEN 5-325 MG PO TABS: 1.0000 | ORAL_TABLET | Freq: Four times a day (QID) | ORAL | 0 refills | Status: AC | PRN

## 2025-01-09 MED ORDER — SENNOSIDES-DOCUSATE SODIUM 8.6-50 MG PO TABS: 1.0000 | ORAL_TABLET | Freq: Two times a day (BID) | ORAL | 0 refills | Status: AC

## 2025-01-09 NOTE — Progress Notes (Signed)
Patient discharged: Home with family  Via: Wheelchair   Discharge paperwork given: to patient and family  Reviewed with teach back  IV removed  Belongings given to patient

## 2025-01-09 NOTE — Consult Note (Addendum)
 WOC Nurse ostomy follow up Stoma type/location: ureterostomy RLQ. Stomal assessment/size: no stoma, opening with 1 stent. Peristomal assessment: Intact, no leaking since the last pouch changed 01/16. Treatment options for stomal/peristomal skin: Ring WD# [id number]. Output clear yellow urine with sediments. Ostomy pouching: 1pc. Convex WD# [id number]  Education provided:  Provided educational session with patient. She asked me to change her bag and review the steps with her. She used a ship broker and engaged with the visit. Explained role of ostomy nurse and creation of stoma  Explained stoma characteristics, ureterostomy does not have an ostomy normal appearance, will be a tiny opening from the ureter to the abdomen, constantly draining urine. Current happening from the stent, when removed, will be out through the opening. Demonstrated pouch change (cutting new barrier, measuring stoma, cleaning peristomal skin and stoma, use of barrier ring) Education on use wick in stoma to keep skin dry with pouch change Education on emptying when 1/3 to 1/2 full and how to empty, mentioning the foley bag use, leg bag or only the pouch. Education on urine characteristics (sediment) Demonstrated hooking pouch to nighttime drainage bag. Discussed bathing, dehydration, occlusion.   Provided patient with [facility] and marked items currently using. Provided ostomy clinic folder, asked a referral. Enrolled patient in [place] Secure Start Discharge program: Yes, 01/16. Patient has ostomy supplies for discharge. Requested one extra draining bag for the patient provided by bed-nurse.  WOC team will follow THUS if inpatient, plans for dc today.  Please reconsult if further assistance is needed. Thank-you,  Lela Holm MSN, RN, CWCN, CNS.  (Phone [phone])

## 2025-01-09 NOTE — Discharge Summary (Signed)
 Physician Discharge Summary  Patient ID: [name] MRN: [id number] DOB/AGE: 06/17/1961 64 y.o.  Admit date: 01/03/2025 Discharge date: 01/09/2025  Admission Diagnoses:Bladder and Left Ureteral Cancer  Discharge Diagnoses:  Principal Problem:   Bladder cancer [facility])   Discharged Condition: good  Hospital Course:   1- Left Ureteral / Bladder Cancer- s/p LEFT robotic nephroureteretomy  / retroperitoneal node dissection, cystectomy, right cutaneous ureterostomy, extensive adhesiolysis 01/04/25. Admitted day prior for labs, stomal marking, bowel prep. Path pending. Clears POD 0 ,Fulls POD 84m Reg diet POD 2. Hgb 8.8, cr 1.6 at discharge. JP removd prior to discharge as output scant.    2 - Rt Cutaneous Ureterostomy - Rt ureter to skin diversion following surgery above. Osotmy RN team working with house and facile with changes and some sample supplies for home at discharge.    By the early afternoon of POD 5, the day of dishcarge, she is ambulatory, tollerating regular diet, pain controlled on PO meds and felt to be adequate for discharge.     Consults: ostomy RN  Significant Diagnostic Studies: labs: as per above  Treatments: surgery: as per above  Discharge Exam: Blood pressure (!) 155/88, pulse (!) 105, temperature 99.5 F (37.5 C), temperature source Oral, resp. rate 20, height 5' 7 (1.702 m), weight 78.5 kg, last menstrual period 02/05/1991, SpO2 93%.    NAD, AOx3 NLB-RA SNTND, recent surgical sites c/d/I, Jp with non-foul serosanguinous fluid. Removed and dry dressing placed.  RLQ cutaneous ureterosctomy patent of copius non-foul pink urine, distal bander stent in place Nil vaginal spotting No c/c/e, SCD's in place    Disposition:    Allergies as of 01/09/2025       Reactions   Augmentin [amoxicillin-pot Clavulanate] Diarrhea, Nausea And Vomiting   Pt decreased dose to QD HS and tolerated well        Medication List     TAKE these medications     acetaminophen  325 MG tablet Commonly known as: TYLENOL  Take 650 mg by mouth 2 (two) times daily as needed for mild pain.   albuterol  108 (90 Base) MCG/ACT inhaler Commonly known as: VENTOLIN  HFA Inhale 1-2 puffs into the lungs every 6 (six) hours as needed for wheezing or shortness of breath.   hydrochlorothiazide  25 MG tablet Commonly known as: HYDRODIURIL  Take 25 mg by mouth daily.   oxyCODONE -acetaminophen  5-325 MG tablet Commonly known as: Percocet Take 1 tablet by mouth every 6 (six) hours as needed for moderate pain (pain score 4-6) or severe pain (pain score 7-10) (post-operativley).   senna-docusate 8.6-50 MG tablet Commonly known as: Senokot-S Take 1 tablet by mouth 2 (two) times daily. While taking strong pain meds to prevent constipation. What changed: Another medication with the same name was added. Make sure you understand how and when to take each.   senna-docusate 8.6-50 MG tablet Commonly known as: Senokot-S Take 1 tablet by mouth 2 (two) times daily. While taking strong pain meds to prevent constipation. What changed: You were already taking a medication with the same name, and this prescription was added. Make sure you understand how and when to take each.   VITAMIN B12 PO Take 1 tablet by mouth daily.        Follow-up Information     Alvaro Ricardo KATHEE Raddle., MD Follow up on 01/24/2025.   Specialty: Urology Why: at 9:15 for MD visit. Contact information: [address] [place] [address] [phone]  Signed: [name]. 01/09/2025, 12:26 PM

## 2025-01-10 LAB — SURGICAL PATHOLOGY

## 2025-01-13 LAB — "CREATININE, BODY FLUID OTHER       ": Creatinine, Body Fluid: 3 mg/dL

## 2025-02-07 ENCOUNTER — Inpatient Hospital Stay

## 2025-02-10 ENCOUNTER — Inpatient Hospital Stay
